# Patient Record
Sex: Male | Born: 1990 | Race: Black or African American | Hispanic: No | Marital: Single | Smoking: Current some day smoker
Health system: Southern US, Community
[De-identification: ages and names within clinical notes are randomized; demographics above are authoritative.]

## PROBLEM LIST (undated history)

## (undated) DIAGNOSIS — A549 Gonococcal infection, unspecified: Secondary | ICD-10-CM

## (undated) DIAGNOSIS — S6010XA Contusion of unspecified finger with damage to nail, initial encounter: Secondary | ICD-10-CM

## (undated) DIAGNOSIS — B86 Scabies: Secondary | ICD-10-CM

## (undated) DIAGNOSIS — M419 Scoliosis, unspecified: Secondary | ICD-10-CM

## (undated) DIAGNOSIS — A539 Syphilis, unspecified: Secondary | ICD-10-CM

## (undated) DIAGNOSIS — B360 Pityriasis versicolor: Secondary | ICD-10-CM

## (undated) DIAGNOSIS — L732 Hidradenitis suppurativa: Secondary | ICD-10-CM

## (undated) DIAGNOSIS — L0292 Furuncle, unspecified: Secondary | ICD-10-CM

## (undated) DIAGNOSIS — Z21 Asymptomatic human immunodeficiency virus [HIV] infection status: Secondary | ICD-10-CM

## (undated) DIAGNOSIS — B20 Human immunodeficiency virus [HIV] disease: Secondary | ICD-10-CM

## (undated) HISTORY — DX: Contusion of unspecified finger with damage to nail, initial encounter: S60.10XA

## (undated) HISTORY — DX: Pityriasis versicolor: B36.0

## (undated) HISTORY — DX: Hidradenitis suppurativa: L73.2

## (undated) HISTORY — PX: BACK SURGERY: SHX140

## (undated) HISTORY — DX: Scabies: B86

## (undated) HISTORY — DX: Furuncle, unspecified: L02.92

## (undated) HISTORY — PX: SPINE SURGERY: SHX786

---
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
## 2011-02-13 ENCOUNTER — Emergency Department (HOSPITAL_COMMUNITY)
Admission: EM | Admit: 2011-02-13 | Discharge: 2011-02-14 | Discharge status: Against Medical Advice | Payer: Medicaid Other

## 2011-02-13 NOTE — ED Notes (Signed)
Pt called for triage x3 and does not present.  Pt placed in OTF status.

## 2011-02-26 ENCOUNTER — Emergency Department (HOSPITAL_COMMUNITY)
Admission: EM | Admit: 2011-02-26 | Discharge: 2011-02-27 | Disposition: A | Discharge status: Home / Self Care | Payer: Medicaid Other | Attending: Emergency Medicine | Admitting: Emergency Medicine

## 2011-02-26 ENCOUNTER — Encounter (HOSPITAL_COMMUNITY): Payer: Self-pay | Admitting: *Deleted

## 2011-02-26 DIAGNOSIS — S0180XA Unspecified open wound of other part of head, initial encounter: Secondary | ICD-10-CM | POA: Insufficient documentation

## 2011-02-26 DIAGNOSIS — R51 Headache: Secondary | ICD-10-CM | POA: Insufficient documentation

## 2011-02-26 DIAGNOSIS — IMO0002 Reserved for concepts with insufficient information to code with codable children: Secondary | ICD-10-CM | POA: Insufficient documentation

## 2011-02-26 DIAGNOSIS — S01112A Laceration without foreign body of left eyelid and periocular area, initial encounter: Secondary | ICD-10-CM

## 2011-02-26 NOTE — ED Notes (Signed)
Pt in c/o laceration above left eyebrow, denies LOC, states he hit his head on a car door

## 2011-02-27 MED ORDER — TETANUS-DIPHTH-ACELL PERTUSSIS 5-2.5-18.5 LF-MCG/0.5 IM SUSP
0.5000 mL | Freq: Once | INTRAMUSCULAR | Status: AC
  Administered 2011-02-27: 0.5 mL via INTRAMUSCULAR
  Filled 2011-02-27: qty 0.5

## 2011-02-27 MED ORDER — LIDOCAINE HCL 1 % IJ SOLN
INTRAMUSCULAR | Status: AC
  Administered 2011-02-27: 05:00:00
  Filled 2011-02-27: qty 20

## 2011-02-27 MED ORDER — IBUPROFEN 600 MG PO TABS: 600.0000 mg | ORAL_TABLET | Freq: Four times a day (QID) | ORAL | Status: AC | PRN

## 2011-02-27 NOTE — ED Notes (Signed)
Pt alert, nad, c/o laceration above left eye, 1 cm laceration noted, bleeding stopped, tet unknown

## 2011-02-27 NOTE — ED Provider Notes (Signed)
History     CSN: [id number] Arrival date & time: 02/26/2011  9:57 PM   First MD Initiated Contact with Patient 02/27/11 0355      Chief Complaint  Patient presents with  . Facial Laceration    (Consider location/radiation/quality/duration/timing/severity/associated sxs/prior treatment) HPI Comments: Patient here after hitting his head on the pointy part of the car door - 1cm laceration to left eyebrow - no LOC  Patient is a 20 y.o. male presenting with scalp laceration. The history is provided by the patient. No language interpreter was used.  Head Laceration This is a new problem. The current episode started today. The problem occurs rarely. The problem has been unchanged. Associated symptoms include headaches. Pertinent negatives include no anorexia, chest pain, chills, coughing, diaphoresis, fatigue, fever, myalgias, nausea, neck pain, rash, urinary symptoms, vertigo, visual change or vomiting. The symptoms are aggravated by nothing. He has tried nothing for the symptoms. The treatment provided no relief.    History reviewed. No pertinent past medical history.  Past Surgical History  Procedure Date  . Spine surgery     History reviewed. No pertinent family history.  History  Substance Use Topics  . Smoking status: Current Some Day Smoker    Types: Cigarettes  . Smokeless tobacco: Not on file  . Alcohol Use: No      Review of Systems  Constitutional: Negative for fever, chills, diaphoresis and fatigue.  HENT: Negative for neck pain.   Respiratory: Negative for cough.   Cardiovascular: Negative for chest pain.  Gastrointestinal: Negative for nausea, vomiting and anorexia.  Musculoskeletal: Negative for myalgias.  Skin: Negative for rash.  Neurological: Positive for headaches. Negative for vertigo.  All other systems reviewed and are negative.    Allergies  Review of patient's allergies indicates no known allergies.  Home Medications   Current Outpatient Rx    Name Route Sig Dispense Refill  . ESOMEPRAZOLE MAGNESIUM 20 MG PO CPDR Oral Take 1 capsule (20 mg total) by mouth daily. 90 capsule 3    BP 130/69  Pulse 96  Temp(Src) 98.6 F (37 C) (Oral)  Resp 20  SpO2 100%  Physical Exam  Nursing note and vitals reviewed. Constitutional: He is oriented to person, place, and time. He appears well-developed and well-nourished. No distress.  HENT:  Head: Normocephalic.    Right Ear: External ear normal.  Left Ear: External ear normal.  Nose: Nose normal.  Mouth/Throat: Oropharynx is clear and moist.  Eyes: Conjunctivae and EOM are normal. Pupils are equal, round, and reactive to light.  Neck: Normal range of motion. Neck supple.  Cardiovascular: Normal rate, regular rhythm and normal heart sounds.   Pulmonary/Chest: Effort normal and breath sounds normal.  Abdominal: Soft. Bowel sounds are normal. There is no tenderness.  Musculoskeletal: Normal range of motion.  Neurological: He is alert and oriented to person, place, and time. He has normal reflexes.  Skin: Skin is warm and dry.  Psychiatric: He has a normal mood and affect. His behavior is normal. Judgment and thought content normal.    ED Course  LACERATION REPAIR Date/Time: 02/27/2011 4:53 AM Performed by: Marisue Humble, Jonet Mathies C. Authorized by: Patrecia Pour Consent: Verbal consent obtained. Written consent not obtained. Risks and benefits: risks, benefits and alternatives were discussed Consent given by: patient Patient understanding: patient states understanding of the procedure being performed Patient consent: the patient's understanding of the procedure does not match consent given Procedure consent: procedure consent does not match procedure scheduled Relevant documents: relevant documents  not present or verified Test results: test results not available Site marked: the operative site was not marked Imaging studies: imaging studies not available Patient identity  confirmed: verbally with patient and arm band Time out: Immediately prior to procedure a "time out" was called to verify the correct patient, procedure, equipment, support staff and site/side marked as required. Body area: head/neck Location details: left eyebrow Laceration length: 1 cm Foreign bodies: no foreign bodies Tendon involvement: none Nerve involvement: none Vascular damage: no Anesthesia: local infiltration Local anesthetic: lidocaine 1% without epinephrine Anesthetic total: 2 ml Patient sedated: no Preparation: Patient was prepped and draped in the usual sterile fashion. Irrigation solution: saline Irrigation method: syringe Amount of cleaning: standard Debridement: none Degree of undermining: none Skin closure: 6-0 nylon Number of sutures: 3 Technique: simple Approximation: loose Approximation difficulty: simple Dressing: antibiotic ointment and 4x4 sterile gauze Patient tolerance: Patient tolerated the procedure well with no immediate complications.   (including critical care time)  Labs Reviewed - No data to display No results found.   1cm laceration to left eyebrow   MDM  Simple laceration to left eyebrow - no LOC         Scarlette Calico C. [place], [place] 02/27/11 [phone]

## 2011-02-27 NOTE — ED Provider Notes (Signed)
Medical screening examination/treatment/procedure(s) were performed by non-physician practitioner and as supervising physician I was immediately available for consultation/collaboration.   Randal Yepiz L Feleshia Zundel, MD 02/27/11 0713 

## 2011-02-27 NOTE — ED Notes (Signed)
Bed:WA01<BR> Expected date:<BR> Expected time:<BR> Means of arrival:<BR> Comments:<BR> closed

## 2011-04-01 ENCOUNTER — Encounter (HOSPITAL_COMMUNITY): Payer: Self-pay | Admitting: *Deleted

## 2011-04-01 ENCOUNTER — Emergency Department (HOSPITAL_COMMUNITY)
Admission: EM | Admit: 2011-04-01 | Discharge: 2011-04-01 | Disposition: A | Discharge status: Home / Self Care | Payer: Medicaid Other | Attending: Emergency Medicine | Admitting: Emergency Medicine

## 2011-04-01 DIAGNOSIS — R51 Headache: Secondary | ICD-10-CM | POA: Insufficient documentation

## 2011-04-01 DIAGNOSIS — R070 Pain in throat: Secondary | ICD-10-CM | POA: Insufficient documentation

## 2011-04-01 DIAGNOSIS — R509 Fever, unspecified: Secondary | ICD-10-CM | POA: Insufficient documentation

## 2011-04-01 DIAGNOSIS — R112 Nausea with vomiting, unspecified: Secondary | ICD-10-CM | POA: Insufficient documentation

## 2011-04-01 DIAGNOSIS — R0602 Shortness of breath: Secondary | ICD-10-CM | POA: Insufficient documentation

## 2011-04-01 DIAGNOSIS — J3489 Other specified disorders of nose and nasal sinuses: Secondary | ICD-10-CM | POA: Insufficient documentation

## 2011-04-01 DIAGNOSIS — R109 Unspecified abdominal pain: Secondary | ICD-10-CM | POA: Insufficient documentation

## 2011-04-01 DIAGNOSIS — J069 Acute upper respiratory infection, unspecified: Secondary | ICD-10-CM | POA: Insufficient documentation

## 2011-04-01 DIAGNOSIS — R209 Unspecified disturbances of skin sensation: Secondary | ICD-10-CM | POA: Insufficient documentation

## 2011-04-01 HISTORY — DX: Scoliosis, unspecified: M41.9

## 2011-04-01 MED ORDER — SODIUM CHLORIDE 0.9 % IV BOLUS (SEPSIS)
1000.0000 mL | Freq: Once | INTRAVENOUS | Status: AC
  Administered 2011-04-01: 1000 mL via INTRAVENOUS

## 2011-04-01 MED ORDER — MORPHINE SULFATE 2 MG/ML IJ SOLN
2.0000 mg | Freq: Once | INTRAMUSCULAR | Status: AC
  Administered 2011-04-01: 2 mg via INTRAVENOUS
  Filled 2011-04-01: qty 1

## 2011-04-01 MED ORDER — IBUPROFEN 800 MG PO TABS: 800.0000 mg | ORAL_TABLET | Freq: Three times a day (TID) | ORAL | Status: DC

## 2011-04-01 MED ORDER — IBUPROFEN 800 MG PO TABS: 800.0000 mg | ORAL_TABLET | Freq: Three times a day (TID) | ORAL | Status: AC

## 2011-04-01 MED ORDER — HYDROCODONE-ACETAMINOPHEN 5-325 MG PO TABS: 1.0000 | ORAL_TABLET | ORAL | Status: AC | PRN

## 2011-04-01 MED ORDER — MORPHINE SULFATE 4 MG/ML IJ SOLN
4.0000 mg | Freq: Once | INTRAMUSCULAR | Status: AC
  Administered 2011-04-01: 4 mg via INTRAVENOUS
  Filled 2011-04-01: qty 1

## 2011-04-01 MED ORDER — HYDROCODONE-ACETAMINOPHEN 5-325 MG PO TABS: 1.0000 | ORAL_TABLET | ORAL | Status: DC | PRN

## 2011-04-01 MED ORDER — ONDANSETRON HCL 4 MG/2ML IJ SOLN
4.0000 mg | Freq: Once | INTRAMUSCULAR | Status: AC
  Administered 2011-04-01: 4 mg via INTRAVENOUS
  Filled 2011-04-01: qty 2

## 2011-04-01 MED ORDER — ACETAMINOPHEN 325 MG PO TABS
650.0000 mg | ORAL_TABLET | Freq: Once | ORAL | Status: AC
  Administered 2011-04-01: 650 mg via ORAL
  Filled 2011-04-01: qty 2

## 2011-04-01 MED ORDER — KETOROLAC TROMETHAMINE 30 MG/ML IJ SOLN: 30.0000 mg | Freq: Once | INTRAMUSCULAR | Status: DC

## 2011-04-01 MED ORDER — ONDANSETRON 8 MG PO TBDP: 8.0000 mg | ORAL_TABLET | Freq: Three times a day (TID) | ORAL | Status: AC | PRN

## 2011-04-01 MED ORDER — KETOROLAC TROMETHAMINE 30 MG/ML IJ SOLN
30.0000 mg | Freq: Once | INTRAMUSCULAR | Status: AC
  Administered 2011-04-01: 30 mg via INTRAVENOUS
  Filled 2011-04-01: qty 1

## 2011-04-01 NOTE — ED Notes (Signed)
Patient complains of abd pain, nausea, hot and cold spells. Pt jerking on stretcher, sts painful because he is dry heaving and would rather vomit something up but cant get anything up due to lack of appetite. Started yesterday.

## 2011-04-01 NOTE — ED Notes (Signed)
No needs at this time, abc intact, will monitor pt.

## 2011-04-01 NOTE — ED Provider Notes (Signed)
Medical screening examination/treatment/procedure(s) were performed by non-physician practitioner and as supervising physician I was immediately available for consultation/collaboration.  Marc Sivertsen P Tallie Dodds, MD 04/01/11 1630 

## 2011-04-01 NOTE — ED Notes (Signed)
Pt is here with fever, sorethroat, headache, vomiting, and nausea.  Body aches.

## 2011-04-01 NOTE — ED Provider Notes (Signed)
History     CSN: [id number]  Arrival date & time 04/01/11  4098   First MD Initiated Contact with Patient 04/01/11 [phone]      Chief Complaint  Patient presents with  . Fever  . Sore Throat  . Headache    (Consider location/radiation/quality/duration/timing/severity/associated sxs/prior treatment) HPI History provided by pt.   Pt developed f/c, diffuse headache, nasal congestion, sore throat, cough, SOB, diffuse lower abd pain, nausea and 1 episode of vomiting yesterday morning.  Denies diarrhea and urinary sx.  Known sick contacts.    Past Medical History  Diagnosis Date  . Scoliosis     Past Surgical History  Procedure Date  . Spine surgery   . Back surgery     No family history on file.  History  Substance Use Topics  . Smoking status: Current Some Day Smoker    Types: Cigarettes  . Smokeless tobacco: Not on file  . Alcohol Use: No      Review of Systems  All other systems reviewed and are negative.    Allergies  Review of patient's allergies indicates no known allergies.  Home Medications  No current outpatient prescriptions on file.  BP 118/67  Pulse 110  Temp(Src) 100 F (37.8 C) (Oral)  Resp 18  SpO2 98%  Physical Exam  Nursing note and vitals reviewed. Constitutional: He is oriented to person, place, and time. He appears well-developed and well-nourished. No distress.  HENT:  Head: Normocephalic and atraumatic.  Mouth/Throat: Oropharynx is clear and moist.  Eyes:       Normal appearance  Neck: Normal range of motion.  Cardiovascular: Normal rate and regular rhythm.   Pulmonary/Chest: Effort normal and breath sounds normal.  Abdominal: Soft. Bowel sounds are normal. He exhibits no distension.       Diffuse, mild lower abd tenderness. Pt does not appear to be uncomfortable w/ palpation.  Musculoskeletal: Normal range of motion.  Neurological: He is alert and oriented to person, place, and time. He has normal reflexes. No cranial nerve  deficit or sensory deficit. He displays a negative Romberg sign. Coordination and gait normal.       Pt has decreased sensation in every nerve distribution bilateral LE.  Tested both light and sharp touch and can feel needle better.  5/5 and equal upper and lower extremity strength.  No past pointing.  No pronator drift.    Skin: Skin is warm. No rash noted.  Psychiatric: He has a normal mood and affect. His behavior is normal.    ED Course  Procedures (including critical care time)  Labs Reviewed - No data to display No results found.   1. Viral URI   2. Nausea and vomiting       MDM  Healthy 21yo M presents w/ c/o headache, sore throat, cough, abd pain, N/V since yesterday.  On exam, elevated temp, mild tachycardia, no respiratory distress, no coughing, no vomiting, abd benign but diffuse, mild lower abd ttp.  Pt received IV fluids, pain and nausea medication and tylenol.  Temp improved from 100.0 to 99.2 and HR 110 to 95.  Pt reports feeling better.  Continues to have a sore throat.  Based on centor criteria, strep very unlikely.  Based on duration of cough and associated upper resp tract sx, pneumonia unlikely.  Pt receiving more fluids and another 2mg  morphine and I have given him a po challenge.  Will reassess shortly. 9:16 AM   Pt reports that he is feeling better and  would like to go home.  HR 82.  No vomiting in ED and he is tolerating pos.  D/c'd home w/ zofran, ibuprofen and vicodin and recommendation to rest and drink plenty of fluids.  He has a PCP to f/u with.  Return precautions discussed.         Richard Rojas, [place] 04/01/11 1549

## 2011-05-18 ENCOUNTER — Inpatient Hospital Stay (HOSPITAL_COMMUNITY)
Admission: EM | Admit: 2011-05-18 | Discharge: 2011-05-23 | DRG: 977 | Disposition: A | Discharge status: Home / Self Care | Payer: Medicaid Other | Source: Ambulatory Visit | Attending: Family Medicine | Admitting: Family Medicine

## 2011-05-18 ENCOUNTER — Encounter (HOSPITAL_COMMUNITY): Payer: Self-pay | Admitting: *Deleted

## 2011-05-18 DIAGNOSIS — K209 Esophagitis, unspecified without bleeding: Secondary | ICD-10-CM | POA: Diagnosis present

## 2011-05-18 DIAGNOSIS — N179 Acute kidney failure, unspecified: Secondary | ICD-10-CM | POA: Diagnosis present

## 2011-05-18 DIAGNOSIS — R1032 Left lower quadrant pain: Secondary | ICD-10-CM

## 2011-05-18 DIAGNOSIS — R197 Diarrhea, unspecified: Secondary | ICD-10-CM | POA: Diagnosis present

## 2011-05-18 DIAGNOSIS — E871 Hypo-osmolality and hyponatremia: Secondary | ICD-10-CM | POA: Diagnosis present

## 2011-05-18 DIAGNOSIS — K219 Gastro-esophageal reflux disease without esophagitis: Secondary | ICD-10-CM

## 2011-05-18 DIAGNOSIS — E860 Dehydration: Secondary | ICD-10-CM

## 2011-05-18 DIAGNOSIS — K5289 Other specified noninfective gastroenteritis and colitis: Secondary | ICD-10-CM | POA: Diagnosis present

## 2011-05-18 DIAGNOSIS — M4120 Other idiopathic scoliosis, site unspecified: Secondary | ICD-10-CM | POA: Diagnosis present

## 2011-05-18 DIAGNOSIS — R112 Nausea with vomiting, unspecified: Secondary | ICD-10-CM

## 2011-05-18 DIAGNOSIS — D696 Thrombocytopenia, unspecified: Secondary | ICD-10-CM | POA: Diagnosis present

## 2011-05-18 DIAGNOSIS — R195 Other fecal abnormalities: Secondary | ICD-10-CM | POA: Diagnosis not present

## 2011-05-18 DIAGNOSIS — R933 Abnormal findings on diagnostic imaging of other parts of digestive tract: Secondary | ICD-10-CM

## 2011-05-18 DIAGNOSIS — E46 Unspecified protein-calorie malnutrition: Secondary | ICD-10-CM | POA: Diagnosis present

## 2011-05-18 DIAGNOSIS — E876 Hypokalemia: Secondary | ICD-10-CM | POA: Diagnosis present

## 2011-05-18 DIAGNOSIS — B20 Human immunodeficiency virus [HIV] disease: Principal | ICD-10-CM | POA: Diagnosis present

## 2011-05-18 DIAGNOSIS — R4182 Altered mental status, unspecified: Secondary | ICD-10-CM

## 2011-05-18 HISTORY — DX: Syphilis, unspecified: A53.9

## 2011-05-18 MED ORDER — ONDANSETRON 8 MG PO TBDP
8.0000 mg | ORAL_TABLET | Freq: Once | ORAL | Status: AC
  Administered 2011-05-18: 8 mg via ORAL
  Filled 2011-05-18: qty 1

## 2011-05-18 MED ORDER — ACETAMINOPHEN 500 MG PO TABS
1000.0000 mg | ORAL_TABLET | Freq: Once | ORAL | Status: AC
  Administered 2011-05-18: 1000 mg via ORAL
  Filled 2011-05-18: qty 2

## 2011-05-18 NOTE — ED Notes (Signed)
Pt c/o n/v/d and fever since past Friday. Pt states he has been treating self w/ imodium and tylenol at home w/o relief.

## 2011-05-18 NOTE — ED Notes (Signed)
Pt vomiting in triage 

## 2011-05-19 ENCOUNTER — Inpatient Hospital Stay (HOSPITAL_COMMUNITY): Payer: Medicaid Other

## 2011-05-19 LAB — URINALYSIS, ROUTINE W REFLEX MICROSCOPIC
Glucose, UA: NEGATIVE mg/dL
Ketones, ur: 15 mg/dL — AB
Leukocytes, UA: NEGATIVE
Nitrite: NEGATIVE
Protein, ur: 30 mg/dL — AB
Urobilinogen, UA: 0.2 mg/dL (ref 0.0–1.0)

## 2011-05-19 LAB — DIFFERENTIAL
Basophils Absolute: 0 10*3/uL (ref 0.0–0.1)
Eosinophils Absolute: 0.1 10*3/uL (ref 0.0–0.7)
Eosinophils Relative: 1 % (ref 0–5)
Lymphocytes Relative: 13 % (ref 12–46)
Lymphs Abs: 0.5 10*3/uL — ABNORMAL LOW (ref 0.7–4.0)
Monocytes Absolute: 0.4 10*3/uL (ref 0.1–1.0)
Monocytes Relative: 14 % — ABNORMAL HIGH (ref 3–12)
Monocytes Relative: 10 % (ref 3–12)
Neutro Abs: 3.9 10*3/uL (ref 1.7–7.7)
Neutro Abs: 3.5 10*3/uL (ref 1.7–7.7)
Neutrophils Relative %: 72 % (ref 43–77)
WBC Morphology: INCREASED

## 2011-05-19 LAB — RAPID URINE DRUG SCREEN, HOSP PERFORMED
Barbiturates: NOT DETECTED
Benzodiazepines: NOT DETECTED
Cocaine: NOT DETECTED
Tetrahydrocannabinol: POSITIVE — AB

## 2011-05-19 LAB — COMPREHENSIVE METABOLIC PANEL
ALT: 13 U/L (ref 0–53)
AST: 31 U/L (ref 0–37)
Alkaline Phosphatase: 55 U/L (ref 39–117)
CO2: 25 mEq/L (ref 19–32)
Calcium: 8.1 mg/dL — ABNORMAL LOW (ref 8.4–10.5)
GFR calc non Af Amer: >90 mL/min (ref >90)
Potassium: 3 mEq/L — ABNORMAL LOW (ref 3.5–5.1)
Sodium: 126 mEq/L — ABNORMAL LOW (ref 135–145)
Total Protein: 5.9 g/dL — ABNORMAL LOW (ref 6.0–8.3)

## 2011-05-19 LAB — AMMONIA: Ammonia: 48 umol/L (ref 11–60)

## 2011-05-19 LAB — BASIC METABOLIC PANEL
BUN: 16 mg/dL (ref 6–23)
BUN: 9 mg/dL (ref 6–23)
CO2: 25 mEq/L (ref 19–32)
CO2: 22 mEq/L (ref 19–32)
Calcium: 8 mg/dL — ABNORMAL LOW (ref 8.4–10.5)
Chloride: 77 mEq/L — ABNORMAL LOW (ref 96–112)
Chloride: 88 mEq/L — ABNORMAL LOW (ref 96–112)
Chloride: 90 mEq/L — ABNORMAL LOW (ref 96–112)
Creatinine, Ser: 1.06 mg/dL (ref 0.50–1.35)
GFR calc Af Amer: >90 mL/min (ref >90)
GFR calc Af Amer: >90 mL/min (ref >90)
GFR calc Af Amer: >90 mL/min (ref >90)
GFR calc non Af Amer: >90 mL/min (ref >90)
GFR calc non Af Amer: >90 mL/min (ref >90)
Glucose, Bld: 113 mg/dL — ABNORMAL HIGH (ref 70–99)
Glucose, Bld: 117 mg/dL — ABNORMAL HIGH (ref 70–99)
Potassium: 3 mEq/L — ABNORMAL LOW (ref 3.5–5.1)
Potassium: 3.4 mEq/L — ABNORMAL LOW (ref 3.5–5.1)
Potassium: 3.5 mEq/L (ref 3.5–5.1)
Sodium: 123 mEq/L — ABNORMAL LOW (ref 135–145)
Sodium: 122 mEq/L — ABNORMAL LOW (ref 135–145)
Sodium: 123 mEq/L — ABNORMAL LOW (ref 135–145)

## 2011-05-19 LAB — CSF CELL COUNT WITH DIFFERENTIAL: WBC, CSF: 2 /mm3 (ref 0–5)

## 2011-05-19 LAB — CBC
HCT: 45.7 % (ref 39.0–52.0)
HCT: 40.5 % (ref 39.0–52.0)
Hemoglobin: 14.5 g/dL (ref 13.0–17.0)
MCH: 30 pg (ref 26.0–34.0)
MCHC: 36.8 g/dL — ABNORMAL HIGH (ref 30.0–36.0)
MCV: 83.9 fL (ref 78.0–100.0)
Platelets: 120 10*3/uL — ABNORMAL LOW (ref 150–400)
RBC: 4.83 MIL/uL (ref 4.22–5.81)
RDW: 12.6 % (ref 11.5–15.5)
WBC: 5.5 10*3/uL (ref 4.0–10.5)
WBC: 4.4 10*3/uL (ref 4.0–10.5)

## 2011-05-19 LAB — PATHOLOGIST SMEAR REVIEW

## 2011-05-19 LAB — PROTEIN AND GLUCOSE, CSF: Total  Protein, CSF: 56 mg/dL — ABNORMAL HIGH (ref 15–45)

## 2011-05-19 LAB — DIC (DISSEMINATED INTRAVASCULAR COAGULATION)PANEL
D-Dimer, Quant: 6.75 ug/mL-FEU — ABNORMAL HIGH (ref 0.00–0.48)
Fibrinogen: 541 mg/dL — ABNORMAL HIGH (ref 204–475)
INR: 1.38 (ref 0.00–1.49)
Smear Review: NONE SEEN
aPTT: 33 seconds (ref 24–37)

## 2011-05-19 LAB — GRAM STAIN

## 2011-05-19 LAB — HIV ANTIBODY (ROUTINE TESTING W REFLEX): HIV: REACTIVE — AB

## 2011-05-19 LAB — C-REACTIVE PROTEIN: CRP: 36.21 mg/dL — ABNORMAL HIGH (ref <0.60)

## 2011-05-19 LAB — URINE MICROSCOPIC-ADD ON

## 2011-05-19 LAB — CRYPTOCOCCAL ANTIGEN, CSF: Crypto Ag: NEGATIVE

## 2011-05-19 LAB — OCCULT BLOOD X 1 CARD TO LAB, STOOL: Fecal Occult Bld: POSITIVE

## 2011-05-19 LAB — LACTATE DEHYDROGENASE: LDH: 220 U/L (ref 94–250)

## 2011-05-19 LAB — RPR TITER: RPR Titer: 1:4 {titer} — AB

## 2011-05-19 LAB — MRSA PCR SCREENING: MRSA by PCR: NEGATIVE

## 2011-05-19 MED ORDER — MORPHINE SULFATE 2 MG/ML IJ SOLN
1.0000 mg | INTRAMUSCULAR | Status: DC | PRN
  Administered 2011-05-19 – 2011-05-21 (×4): 1 mg via INTRAVENOUS
  Filled 2011-05-19 (×4): qty 1

## 2011-05-19 MED ORDER — ONDANSETRON 8 MG/NS 50 ML IVPB
8.0000 mg | Freq: Once | INTRAVENOUS | Status: AC
  Administered 2011-05-19: 8 mg via INTRAVENOUS
  Filled 2011-05-19: qty 8

## 2011-05-19 MED ORDER — PROMETHAZINE HCL 25 MG/ML IJ SOLN
25.0000 mg | Freq: Four times a day (QID) | INTRAMUSCULAR | Status: DC | PRN
  Administered 2011-05-19 (×2): 25 mg via INTRAVENOUS
  Filled 2011-05-19 (×3): qty 1

## 2011-05-19 MED ORDER — DEXTROSE 5 % IV SOLN
2.0000 g | Freq: Two times a day (BID) | INTRAVENOUS | Status: DC
  Administered 2011-05-19 – 2011-05-20 (×2): 2 g via INTRAVENOUS
  Filled 2011-05-19 (×3): qty 2

## 2011-05-19 MED ORDER — IOHEXOL 300 MG/ML  SOLN
80.0000 mL | Freq: Once | INTRAMUSCULAR | Status: AC | PRN
  Administered 2011-05-19: 80 mL via INTRAVENOUS

## 2011-05-19 MED ORDER — FLUCONAZOLE IN SODIUM CHLORIDE 400-0.9 MG/200ML-% IV SOLN
400.0000 mg | INTRAVENOUS | Status: DC
  Administered 2011-05-19: 400 mg via INTRAVENOUS
  Filled 2011-05-19 (×2): qty 200

## 2011-05-19 MED ORDER — POTASSIUM CHLORIDE 10 MEQ/100ML IV SOLN
10.0000 meq | INTRAVENOUS | Status: AC
  Administered 2011-05-19 (×4): 10 meq via INTRAVENOUS
  Filled 2011-05-19 (×4): qty 100

## 2011-05-19 MED ORDER — SODIUM CHLORIDE 0.9 % IV SOLN: 1000.0000 mL | INTRAVENOUS | Status: DC

## 2011-05-19 MED ORDER — PANTOPRAZOLE SODIUM 40 MG IV SOLR
40.0000 mg | INTRAVENOUS | Status: DC
  Administered 2011-05-19 – 2011-05-22 (×4): 40 mg via INTRAVENOUS
  Filled 2011-05-19 (×5): qty 40

## 2011-05-19 MED ORDER — SODIUM CHLORIDE 0.9 % IV BOLUS (SEPSIS)
1000.0000 mL | Freq: Once | INTRAVENOUS | Status: AC
  Administered 2011-05-19: 1000 mL via INTRAVENOUS

## 2011-05-19 MED ORDER — CIPROFLOXACIN IN D5W 400 MG/200ML IV SOLN
400.0000 mg | Freq: Two times a day (BID) | INTRAVENOUS | Status: DC
  Administered 2011-05-19: 400 mg via INTRAVENOUS
  Filled 2011-05-19 (×2): qty 200

## 2011-05-19 MED ORDER — VANCOMYCIN HCL IN DEXTROSE 1-5 GM/200ML-% IV SOLN
1000.0000 mg | Freq: Three times a day (TID) | INTRAVENOUS | Status: DC
  Administered 2011-05-19 – 2011-05-20 (×2): 1000 mg via INTRAVENOUS
  Filled 2011-05-19 (×4): qty 200

## 2011-05-19 MED ORDER — SODIUM CHLORIDE 0.45 % IV SOLN
INTRAVENOUS | Status: DC
  Administered 2011-05-19: 10:00:00 via INTRAVENOUS

## 2011-05-19 MED ORDER — SODIUM CHLORIDE 0.9 % IV SOLN
INTRAVENOUS | Status: DC
  Administered 2011-05-19: 11:00:00 via INTRAVENOUS
  Administered 2011-05-20 – 2011-05-21 (×3): 1000 mL via INTRAVENOUS
  Administered 2011-05-21: 01:00:00 via INTRAVENOUS

## 2011-05-19 MED ORDER — VANCOMYCIN HCL 1000 MG IV SOLR
750.0000 mg | Freq: Three times a day (TID) | INTRAVENOUS | Status: DC
  Filled 2011-05-19 (×2): qty 750

## 2011-05-19 MED ORDER — ONDANSETRON HCL 4 MG/2ML IJ SOLN
INTRAMUSCULAR | Status: AC
  Filled 2011-05-19: qty 2

## 2011-05-19 MED ORDER — PNEUMOCOCCAL VAC POLYVALENT 25 MCG/0.5ML IJ INJ
0.5000 mL | INJECTION | INTRAMUSCULAR | Status: AC
  Administered 2011-05-20: 0.5 mL via INTRAMUSCULAR
  Filled 2011-05-19: qty 0.5

## 2011-05-19 MED ORDER — PIPERACILLIN-TAZOBACTAM 3.375 G IVPB
3.3750 g | Freq: Three times a day (TID) | INTRAVENOUS | Status: DC
  Filled 2011-05-19 (×2): qty 50

## 2011-05-19 MED ORDER — SODIUM CHLORIDE 0.9 % IV SOLN
5.0000 mg/kg | Freq: Two times a day (BID) | INTRAVENOUS | Status: DC
  Administered 2011-05-19 – 2011-05-20 (×2): 310 mg via INTRAVENOUS
  Filled 2011-05-19 (×3): qty 310

## 2011-05-19 MED ORDER — HEPARIN SODIUM (PORCINE) 5000 UNIT/ML IJ SOLN
5000.0000 [IU] | Freq: Three times a day (TID) | INTRAMUSCULAR | Status: DC
  Filled 2011-05-19 (×4): qty 1

## 2011-05-19 MED ORDER — ONDANSETRON HCL 4 MG/2ML IJ SOLN
4.0000 mg | Freq: Four times a day (QID) | INTRAMUSCULAR | Status: DC | PRN
  Administered 2011-05-19: 4 mg via INTRAVENOUS
  Filled 2011-05-19: qty 2

## 2011-05-19 MED ORDER — ACETAMINOPHEN 500 MG PO TABS
1000.0000 mg | ORAL_TABLET | Freq: Once | ORAL | Status: AC
  Administered 2011-05-19: 1000 mg via ORAL
  Filled 2011-05-19: qty 2

## 2011-05-19 MED ORDER — SODIUM CHLORIDE 0.9 % IV BOLUS (SEPSIS): 1000.0000 mL | Freq: Once | INTRAVENOUS | Status: DC

## 2011-05-19 MED ORDER — ONDANSETRON HCL 4 MG/2ML IJ SOLN
INTRAMUSCULAR | Status: AC
  Filled 2011-05-19: qty 4

## 2011-05-19 MED ORDER — ONDANSETRON 8 MG/NS 50 ML IVPB
8.0000 mg | Freq: Four times a day (QID) | INTRAVENOUS | Status: DC | PRN
  Administered 2011-05-20 – 2011-05-22 (×3): 8 mg via INTRAVENOUS
  Filled 2011-05-19 (×5): qty 8

## 2011-05-19 MED ORDER — METRONIDAZOLE IN NACL 5-0.79 MG/ML-% IV SOLN
500.0000 mg | Freq: Three times a day (TID) | INTRAVENOUS | Status: DC
  Administered 2011-05-19 – 2011-05-23 (×12): 500 mg via INTRAVENOUS
  Filled 2011-05-19 (×17): qty 100

## 2011-05-19 MED ORDER — PROMETHAZINE HCL 25 MG/ML IJ SOLN
25.0000 mg | INTRAMUSCULAR | Status: DC | PRN
  Administered 2011-05-19 – 2011-05-21 (×7): 25 mg via INTRAVENOUS
  Filled 2011-05-19 (×6): qty 1

## 2011-05-19 MED ORDER — PROMETHAZINE HCL 25 MG/ML IJ SOLN
25.0000 mg | INTRAMUSCULAR | Status: AC
  Administered 2011-05-19: 25 mg via INTRAVENOUS
  Filled 2011-05-19: qty 1

## 2011-05-19 MED ORDER — PROMETHAZINE HCL 25 MG PO TABS: 12.5000 mg | ORAL_TABLET | Freq: Four times a day (QID) | ORAL | Status: DC | PRN

## 2011-05-19 NOTE — ED Provider Notes (Signed)
History     CSN: [id number]  Arrival date & time 05/18/11  2219   First MD Initiated Contact with Patient 05/18/11 2329      Chief Complaint  Patient presents with  . Fever  . Nausea  . Emesis    (Consider location/radiation/quality/duration/timing/severity/associated sxs/prior treatment) HPI Comments: 21 year old male with a history of nausea vomiting and diarrhea for 3 days. He states this was acute in onset, persistent, nothing makes this better or worse and it is associated with fever to 102.9 on arrival. He has mild tenderness in the left lower quadrant, no blood in the stools, no dysuria, no rashes, mild headache. He does get mild lightheadedness when he stands. Denies any sick contacts, travel, recent antibiotic use. He denies any other medical problems other than scoliosis status post spine surgery in the past  Patient is a 21 y.o. male presenting with fever and vomiting. The history is provided by the patient, a relative and medical records.  Fever Primary symptoms of the febrile illness include fever and vomiting.  Emesis  Associated symptoms include a fever.    Past Medical History  Diagnosis Date  . Scoliosis     Past Surgical History  Procedure Date  . Spine surgery   . Back surgery     History reviewed. No pertinent family history.  History  Substance Use Topics  . Smoking status: Current Some Day Smoker    Types: Cigarettes  . Smokeless tobacco: Not on file  . Alcohol Use: No      Review of Systems  Constitutional: Positive for fever.  Gastrointestinal: Positive for vomiting.  All other systems reviewed and are negative.    Allergies  Review of patient's allergies indicates no known allergies.  Home Medications  No current outpatient prescriptions on file.  BP 108/82  Pulse 154  Temp(Src) 99.2 F (37.3 C) (Oral)  Resp 20  SpO2 98%  Physical Exam  Nursing note and vitals reviewed. Constitutional: He appears well-developed and  well-nourished.  HENT:  Head: Normocephalic and atraumatic.  Mouth/Throat: No oropharyngeal exudate.       Mucous membranes mildly dehydrated  Eyes: Conjunctivae and EOM are normal. Pupils are equal, round, and reactive to light. Right eye exhibits no discharge. Left eye exhibits no discharge. No scleral icterus.  Neck: Normal range of motion. Neck supple. No JVD present. No thyromegaly present.  Cardiovascular: Regular rhythm, normal heart sounds and intact distal pulses.  Exam reveals no gallop and no friction rub.   No murmur heard.      Tachycardia  Pulmonary/Chest: Effort normal and breath sounds normal. No respiratory distress. He has no wheezes. He has no rales.  Abdominal: Soft. Bowel sounds are normal. He exhibits no distension and no mass. There is tenderness ( Mild left lower quadrant tenderness, no rebound, non-peritoneal, no pain at McBurney's point).  Musculoskeletal: Normal range of motion. He exhibits no edema and no tenderness.  Lymphadenopathy:    He has no cervical adenopathy.  Neurological: He is alert. Coordination normal.  Skin: Skin is warm and dry. No rash noted. No erythema.  Psychiatric: He has a normal mood and affect. His behavior is normal.    ED Course  Procedures (including critical care time)  Labs Reviewed  BASIC METABOLIC PANEL - Abnormal; Notable for the following:    Sodium 116 (*)    Potassium 3.0 (*)    Chloride 77 (*)    Glucose, Bld 131 (*)    All other components within normal  limits  CBC - Abnormal; Notable for the following:    MCHC 36.8 (*) PRE-WARMING TECHNIQUE USED   Platelets 134 (*)    All other components within normal limits  DIFFERENTIAL - Abnormal; Notable for the following:    Monocytes Relative 14 (*)    All other components within normal limits  URINALYSIS, ROUTINE W REFLEX MICROSCOPIC   No results found.   1. Dehydration   2. Hyponatremia       MDM  Fever tachycardia and diarrhea type illness. Will check  potassium, urinalysis to evaluate level of dehydration, IV fluids and antiemetics, antipyretics, reevaluate.  Patient reevaluated and has persistent somnolence though he is easily arousable. His tachycardia has improved significantly to 115 and his fever has defervesced after acetaminophen. Laboratory evaluation reveals that he is significantly hyponatremic with a level of 116, potassium 3.0. Blood counts at this time are normal.  I discussed his care with the admitting family practice resident who has accepted him in transfer to a step down bed to        Vida Roller, MD 05/19/11 [phone]

## 2011-05-19 NOTE — H&P (Signed)
I have seen and examined this patient. I have discussed with Dr (s) Piloto & Alvester Morin.  I agree with their findings and plans as documented in their admission note.  Acute Issues 1. Acute Colitis - Infectious Vs Inflammatory origin - Sending Stool for routine culture, lactorferrin, hemoccult, C.diff PCR, EHEC stool antigen - Starting Cipro/Flagyl IV empirically - If not improved in next 48 hours or if condition worsens then consult GI service for consideration of endoscopic evaluation.   2. Hyponatremia, hypovolemic - Secondary to nausea & vomiting from problem no. 1 - Na 116 -- 6 hours/IV NS --> 126. - Avoid rapid correction of serum sodium concentration. Keep rise in sodium concentration to less than 12 mEq/L in 24 hours - Decrease Normal Saline IVF rate. Recheck serum sodium. If continuing to rise, change to D5 0.45 Normal Saline and place at [facility], then recheck serum sodium in 6 hours to see if able to resume rate of IVF rate at closer to maintenance rate.

## 2011-05-19 NOTE — Progress Notes (Addendum)
ANTIBIOTIC CONSULT NOTE - INITIAL  Pharmacy Consult for Vancomycin and Zosyn Indication: febrile illness  No Known Allergies  Patient Measurements: Height: 6\' 1"  (185.4 cm) IBW/kg (Calculated) : 79.9  Actual wt: 66.9kg per RN  Vital Signs: Temp: 102.9 F (39.4 C) (03/04 1807) Temp src: Oral (03/04 1807) BP: 125/62 mmHg (03/04 1759) Pulse Rate: 129  (03/04 1807) Intake/Output from previous day: 03/03 0701 - 03/04 0700 In: 50 [IV Piggyback:50] Out: 400 [Urine:400] Intake/Output from this shift: Total I/O In: 2548.2 [P.O.:240; I.V.:1696.2; IV Piggyback:612] Out: 150 [Urine:150]  Labs:  Basename 05/19/11 1330 05/19/11 1050 05/19/11 1043 05/19/11 0600 05/19/11 0015  WBC -- -- -- 4.4 5.5  HGB -- -- -- 14.5 16.8  PLT -- -- 125* 120* 134*  LABCREA -- -- -- -- --  CREATININE 0.84 0.84 -- 0.82 --   The CrCl is unknown because both a height and weight (above a minimum accepted value) are required for this calculation. No results found for this basename: VANCOTROUGH:2,VANCOPEAK:2,VANCORANDOM:2,GENTTROUGH:2,GENTPEAK:2,GENTRANDOM:2,TOBRATROUGH:2,TOBRAPEAK:2,TOBRARND:2,AMIKACINPEAK:2,AMIKACINTROU:2,AMIKACIN:2, in the last 72 hours   Microbiology: Recent Results (from the past 720 hour(s))  MRSA PCR SCREENING     Status: Normal   Collection Time   05/19/11  4:49 AM      Component Value Range Status Comment   MRSA by PCR NEGATIVE  NEGATIVE  Final   CLOSTRIDIUM DIFFICILE BY PCR     Status: Normal   Collection Time   05/19/11  5:30 AM      Component Value Range Status Comment   C difficile by pcr NEGATIVE  NEGATIVE  Final     Medical History: Past Medical History  Diagnosis Date  . Scoliosis     Medications:  Scheduled:    . acetaminophen  1,000 mg Oral Once  . acetaminophen  1,000 mg Oral Once  . metronidazole  500 mg Intravenous Q8H  . ondansetron (ZOFRAN) IV  8 mg Intravenous Once  . ondansetron  8 mg Oral Once  . pantoprazole (PROTONIX) IV  40 mg Intravenous Q24H  .  pneumococcal 23 valent vaccine  0.5 mL Intramuscular Tomorrow-1000  . potassium chloride  10 mEq Intravenous Q1 Hr x 4  . promethazine  25 mg Intravenous STAT  . sodium chloride  1,000 mL Intravenous Once  . sodium chloride  1,000 mL Intravenous Once  . sodium chloride  1,000 mL Intravenous Once  . DISCONTD: ciprofloxacin  400 mg Intravenous Q12H  . DISCONTD: heparin  5,000 Units Subcutaneous Q8H  . DISCONTD: sodium chloride  1,000 mL Intravenous Once   Assessment: Pt presents with febrile illness (Tm 103) and worsening GI sx over past week to start on empiric antibiotics while being worked up for source of infection (GI virus vs. STD being most likely causes).    Pt has normal renal function (est CrCl >100).    Goal of Therapy:  Vancomycin trough level 15-20 mcg/ml  Plan:  1) Vancomycin 1000mg  IV Q8h 2) Zosyn 3.375gm IV Q8h 3) F/U vanc trough level at steady state 4) F/U pt progress and narrow tx once source determined.  Elson Clan 05/19/2011,6:47 PM

## 2011-05-19 NOTE — ED Notes (Addendum)
Call placed to pt's grandmother, Rosey Bath #[phone]. Grandmother made aware that pt is being transferred to [facility].

## 2011-05-19 NOTE — ED Notes (Signed)
Monica, RN on 3100 given report, [facility] called.

## 2011-05-19 NOTE — H&P (Signed)
Family Medicine Teaching [facility] Admission History and Physical  Patient name: Richard Rojas Medical record number: [id number] Date of birth: [birth year] Age: 21 y.o. Gender: male  Primary Care Provider: Edd Arbour, MD, MD  Chief Complaint: fever, nause vomit and disrrhea. History of Present Illness: Richard Rojas is a 21 y.o. male presenting at [facility] with fever 103 and history of  2 days of intractable nausea, vomiting and diarrhea.Very difficult interview since pt is restless in bed and answers are reduced to "yes or no" He denies use of illicit drugs and alcohol or hx of STD's . Also denies hx of other family members with recent illness, although per Dr at [facility] pt's mother and sister had diarrhea a week ago but nothing closer to what pt is presenting. Pt has not able to eat or drink for 2 days. . Pt since was transferred had vomited (bilious, non-bloody) twice. His other complaint is left lower quadrant pain and mild headache. No chest pain, no SOB.  Patient Active Problem List  Diagnoses  . SCOLIOSIS  . GERD (gastroesophageal reflux disease)  . Unprotected sexual intercourse  . Syphilis  . Tinea versicolor   Past Medical History: Past Medical History  Diagnosis Date  . Scoliosis    Past Surgical History: Past Surgical History  Procedure Date  . Spine surgery   . Back surgery    Social History: History   Social History  . Marital Status: Single    Spouse Name: N/A    Number of Children: N/A  . Years of Education: N/A   Social History Main Topics  . Smoking status: Current Some Day Smoker    Types: Cigarettes  . Smokeless tobacco: None  . Alcohol Use: No  . Drug Use: No  . Sexually Active: Yes    Birth Control/ Protection: None   Other Topics Concern  . None   Social History Narrative  . None   Family History: History reviewed. No pertinent family history. Allergies: No Known Allergies Current Facility-Administered Medications  Medication  Dose Route Frequency Provider Last Rate Last Dose  . acetaminophen (TYLENOL) tablet 1,000 mg  1,000 mg Oral Once Richard Rojas, [place]   1,000 mg at 05/18/11 2305  . acetaminophen (TYLENOL) tablet 1,000 mg  1,000 mg Oral Once Richard Roller, MD   1,000 mg at 05/19/11 0107  . morphine 2 MG/ML injection 1 mg  1 mg Intravenous Q2H PRN Richard Piloto, MD      . ondansetron (ZOFRAN) 8 mg/NS 50 ml IVPB  8 mg Intravenous Once Richard Piloto, MD      . ondansetron (ZOFRAN-ODT) disintegrating tablet 8 mg  8 mg Oral Once Richard Fontana, PA   8 mg at 05/18/11 2305  . promethazine (PHENERGAN) injection 25 mg  25 mg Intravenous STAT Richard Roller, MD   25 mg at 05/19/11 0107  . sodium chloride 0.9 % bolus 1,000 mL  1,000 mL Intravenous Once Richard Roller, MD   1,000 mL at 05/19/11 0114    Review Of Systems:  Review of Systems  Constitutional: Positive for fever.  HENT: Negative for neck pain.   Eyes: Negative for blurred vision.  Respiratory: Negative for cough.   Cardiovascular: Negative for chest pain and palpitations.  Gastrointestinal: Positive for nausea, vomiting, abdominal pain and diarrhea. Negative for blood in stool.  Genitourinary: Negative for dysuria, frequency and hematuria.  Musculoskeletal: Negative for back pain.  Skin: Negative for rash.  Neurological: Positive for headaches. Negative for  dizziness.  Endo/Heme/Allergies: Does not bruise/bleed easily.  Psychiatric/Behavioral: The patient is not nervous/anxious.      Physical Exam: Filed Vitals:   05/19/11 0330  BP:   Pulse:   Temp:   Resp: 22   Gen:  Pt close to cachectic and restless in bed.  Poorly cooperative. HEENT: mildly dry mucous membranes. Neck supple, no adenopathies. Pupils: miosis with minimal reaction to light. EOMI.  CV: Tachycardic, no murmurs rubs or gallops PULM: Clear to auscultation bilaterally. No wheezes/rales/rhonchi ABD: Soft, excavated, tender to palpation isolated on LLQ no guarding,  to  normal bowel sounds.  EXT: No edema. Anal exam. No lesion on anal mucosa Neuro: Alert, agitated and oriented x3. No focalization. 5/5 strength on 4 extremities. No sensorial deficit. No cooperative to explore coordination. Gait not examined at this time. No meningeal signs present on examination.  Labs and Imaging: Lab Results  Component Value Date/Time   NA 116* 05/19/2011 12:15 AM   K 3.0* 05/19/2011 12:15 AM   CL 77* 05/19/2011 12:15 AM   CO2 24 05/19/2011 12:15 AM   BUN 16 05/19/2011 12:15 AM   CREATININE 1.06 05/19/2011 12:15 AM   GLUCOSE 131* 05/19/2011 12:15 AM   Lab Results  Component Value Date   WBC 5.5 05/19/2011   HGB 16.8 05/19/2011   HCT 45.7 05/19/2011   MCV 82.6 05/19/2011   PLT 134* 05/19/2011    Assessment and Plan: Richard Rojas is a 21 y.o.  male presenting with fever, nausea, vomiting and diarrhea. The simplest explanation could be a viral GI with dehydration but pt has presented with a complex picture that make a broader differential including Drug Abuse, STD, TTP/HUS(?) 1. Fever, nausea, vomiting and diarrhea: Most likely viral etiology. Family had been sick with GI symptoms for a  week. Pt symptoms are more severe. We can't r/u a underlying condition since pt was positive last year for Syphilis and has multiple partners with no protected sexual activity. - Admit to inpatient  - Hydration - NPO - Zofran and Phenergan - Norovirus PCR. - Fecal lactoferrin, stool ova and parasite, hemocult. - HIV, CD4, RPR, GC and chlamydia. 2. Altered mental status, headache, miosis: Agitation. Restlessness. Could be secondary to hyponatremia but miosis is not part of the classic presentation.  - UDS, Etoh and Ammonia level  - Head CT  3. Abdominal pain: Could be explained by pt GI symptoms but due to intensity of pain we decided to r/o acute abdominal pathology. -  Abdominal CT  5. Electrolytes disbalances: hyponatremia and hypokalemia we think could be secondary to intractable vomiting and  diarrhea -IV fluids bolus and then continuous with 173ml/h -K repletion  6. Acute renal failure: Cr elevated 1.06 baseline on 0.5. We think secondary to deshydration but more complex picture with acute thrombocytopenia.  - Hydrate and recheck Cr. FeNa.   7. Thombocytopenia: platelet count of 135. Due to complexity and severity of symptoms presented with renal failure and thrombocytopenia it is important r/o HUS or TTP . - Peripheral smear  - DIC panel   FEN/GI: NPO, IV NS and Kcl 10 meq IV X4 Prophylaxis: SCD's Disposition: Pending improvement and test results.  Richard Rojas Richard Rojas PGY-1 FMTS Pager [phone] --------------------------------------------------------------------------------------------------------------------------------------------------------------------------------------------- R3 Addendum:  Patient seen and examined agree with the above. Please see excellent PGY one note for full details. Briefly this is a 21 year old male with a past medical history of syphilis as well as multiple sexual partners presenting with a 2-3 day history  of nausea vomiting abdominal pain diarrhea. Patient's no setting no significant nausea vomiting approximately 2 days ago abruptly progressed to abdominal pain diarrhea. Patient states that both emesis and diarrhea having had been somewhat bilious in nature. Patient's centralizes abdominal pain tip predominantly in the left lower quadrant. Patient also with a MAXIMUM TEMPERATURE of 103 on admission today. No rashes. Per the patient, he denies any illegal drug use. Patient does report recent unprotected sexual activity approximately 2 weeks ago. This was with a male partner. Per the patient, he denies any sick contacts. However, in further discussion with mother who was present at followup visit, she states that she had similar symptoms approximately 2-3 days prior to patient symptoms. Mother states her predominant symptoms were nausea abdominal pain and  diarrhea that minimally to mildly improved with Imodium. Patient states she still mildly symptomatic up until today even with Imodium although her symptoms are markedly improved. The patient states he's never had symptoms like this in the past. Patient denies any dysuria or hematuria. Patient denies any new medications.  Physical Exam:  Gen:  Pt close to cachectic and restless in bed.  Poorly cooperative. HEENT: mildly dry mucous membranes. Neck supple, no adenopathies. Pupils: miosis with minimal reaction to light. EOMI.  CV: Tachycardic, no murmurs rubs or gallops PULM: Clear to auscultation bilaterally. No wheezes/rales/rhonchi ABD: Soft, excavated, tender to palpation isolated on LLQ no guarding,  to normal bowel sounds.  EXT: No edema. Anal exam. No lesion on anal mucosa Neuro: Alert, agitated and oriented x3. No focalization. 5/5 strength on 4 extremities. No sensorial deficit. No cooperative to explore coordination. Gait not examined at this time. No meningeal signs present on examination.  Assessment and Plan:  This is a 21 year old male presenting with fever, abdominal pain, vomiting and diarrhea was also found to be hyponatremic with mildly altered mental status.  Gastrointestinal: Overall symptoms seem to be most consistent with a viral gastroenteritis given recent sick contact mother with similar symptoms. However, there are certain caveats we need to pay attention to including patient's risk for HIV given history of syphilis and unprotected sexual activity. There is also some concern for drug overdose. Will check CD4 count and HIV in addition to RPR, GC, Chlamydia. We'll check UDS. Plan to obtain a CT scan of the abdomen to further assess anatomy. Patient is also noted to be mildly thrombocytopenic with a platelet count of 134. In the setting of fever, mild renal dysfunction, as well as change of baseline mentation with an underlying GI illness there is concern for her TTP/HUS. Will  add on a peripheral smear to further assess this. Still has been sent off for C. difficile. We'll add on stool culture, fecal lactoferrin, stool ova and parasites. We'll also Hemoccult stools. Primary issues right now are rehydration, nausea and pain control, as well as abdominal imaging. When necessary morphine for pain. Zofran or Phenergan for nausea.  Renal: Noted serum sodium of 116 on admission. Clinically within the hypovolemic hyponatremia likely extra renal sources i.e. GI losses. Patient is status post 1 L normal saline bolus. We'll check a BMET after this. Overall goal for sodium repletion is for no greater than 10-12 mEq of sodium repletion in 24 hours which should be a sodium of 128 within 24 hours in this patient. Potassium 3.0 on admission. Likely secondary to GI losses. Will replete this via IV route currently.  Neuro: Patient is noted to be mildly agitated as well as getting out of bed against staff  wishes. Mom states this is a change in the patient's baseline. We'll check a UDS as well as serum alcohol and ammonia level. No asterixis on exam however patient is noted to have pinpoint pupils on ophthalmic evaluation which again may point to drug overdose. However, patient also has significant acute hyponatremia which may be underlying cause of the mental status changes. No meningitic signs on exam which is overall reassuring.  Cardiovascular: Noted tachycardia on admission likely secondary to hypovolemia. Hemodynamically stable from a blood pressure standpoint. Currently being rehydrated. Will continue clinically follow.  Infectious disease: Relatively broad differential for this as noted above. Patient does have monocytic predominance of this differential which leads likely more towards a viral etiology. However, patient does have secondary risk factors for underlying underlying chronic infectious disease such as HIV. CD4 count and HIV antibody. RPR, GC chlamydia. Currently no clinical  indications for antibiotics or antiviral medication.  Hematology: Noted thrombocytopenia with a platelet count of 134 and on presentation today. This is a new issue. Unsure of exact etiology of this. However, given GI illness in setting of fever neurological changes there is concern for TTP/HUS as described above. Overall diagnoses not clear-cut. There is no clinical indication for steroids or exchange transfusion currently. A peripheral smear has been ordered. This will help further delineate treatment plan. Will continue clinically follow. SCDs for DVT prophylaxis.  Disposition: Relatively broad differential workup. Pending clinical improvement.   Doree Albee MD  PGY-3 [phone]

## 2011-05-19 NOTE — Procedures (Signed)
CSF puncture at L4-5.  Clear CSF obtained with opening pressure 24 cm H20.  Procedure well tolerated without immediate complications.

## 2011-05-19 NOTE — ED Notes (Signed)
Call placed to the only phone number found on the pt's face sheet.  No family member able to be contacted.

## 2011-05-20 ENCOUNTER — Inpatient Hospital Stay (HOSPITAL_COMMUNITY): Payer: Medicaid Other

## 2011-05-20 DIAGNOSIS — A539 Syphilis, unspecified: Secondary | ICD-10-CM

## 2011-05-20 LAB — BASIC METABOLIC PANEL
CO2: 22 mEq/L (ref 19–32)
Calcium: 8.2 mg/dL — ABNORMAL LOW (ref 8.4–10.5)
Creatinine, Ser: 0.8 mg/dL (ref 0.50–1.35)
GFR calc Af Amer: >90 mL/min (ref >90)
Sodium: 126 mEq/L — ABNORMAL LOW (ref 135–145)

## 2011-05-20 LAB — CBC
Platelets: 110 10*3/uL — ABNORMAL LOW (ref 150–400)
RBC: 4.07 MIL/uL — ABNORMAL LOW (ref 4.22–5.81)
RDW: 12.5 % (ref 11.5–15.5)
WBC: 7.6 10*3/uL (ref 4.0–10.5)

## 2011-05-20 LAB — OCCULT BLOOD X 1 CARD TO LAB, STOOL: Fecal Occult Bld: POSITIVE

## 2011-05-20 LAB — FECAL LACTOFERRIN, QUANT: Fecal Lactoferrin: POSITIVE

## 2011-05-20 LAB — HEPATITIS B SURFACE ANTIBODY,QUALITATIVE: Hep B S Ab: POSITIVE — AB

## 2011-05-20 LAB — T.PALLIDUM AB, IGG: T pallidum Antibodies (TP-PA): >8 S/CO — ABNORMAL HIGH (ref <0.90)

## 2011-05-20 LAB — HEPATITIS C ANTIBODY: HCV Ab: NEGATIVE

## 2011-05-20 MED ORDER — POTASSIUM CHLORIDE 10 MEQ/100ML IV SOLN
10.0000 meq | INTRAVENOUS | Status: AC
  Administered 2011-05-20 (×2): 10 meq via INTRAVENOUS
  Filled 2011-05-20 (×2): qty 100

## 2011-05-20 MED ORDER — FLUCONAZOLE 200 MG PO TABS
400.0000 mg | ORAL_TABLET | Freq: Every day | ORAL | Status: DC
  Administered 2011-05-20 – 2011-05-23 (×4): 400 mg via ORAL
  Filled 2011-05-20 (×4): qty 2

## 2011-05-20 MED ORDER — CIPROFLOXACIN IN D5W 400 MG/200ML IV SOLN
400.0000 mg | Freq: Two times a day (BID) | INTRAVENOUS | Status: DC
  Administered 2011-05-20 – 2011-05-23 (×6): 400 mg via INTRAVENOUS
  Filled 2011-05-20 (×7): qty 200

## 2011-05-20 NOTE — Progress Notes (Signed)
PCP NOTE 5:35 PM Richard Rojas   Patient seen and examined. Agree with Inpatient team management.   Filed Vitals:   05/20/11 1201 05/20/11 1400 05/20/11 1509 05/20/11 1600  BP: 119/75 127/80 122/89 131/80  Pulse: 87 82 100 90  Temp: 98.6 F (37 C)   98.7 F (37.1 C)  TempSrc: Axillary   Axillary  Resp: 22 23 22 29   Height:      Weight:      SpO2: 100% 100% 100% 99%    PE: see Primary team note  A/P:  Problem list and management per primary team  1. Social visit Patient not able to communicate 2nd to AMS. Will check back later.  - Will follow with you  Thank you for the excellent care of this patient.  Edd Arbour MD

## 2011-05-20 NOTE — Progress Notes (Signed)
FMTS Attending Daily Note: Denny Levy MD [phone] pager office [phone] I  have seen and examined this patient, reviewed their chart. I have discussed this patient with the resident. I agree with the resident's findings, assessment and care plan. Appreciate ID consult and care. Will ask GI to consult re his esophageal thickening and his apparent colitis. ECHO ordered as he remains tachycardic.

## 2011-05-20 NOTE — Consult Note (Signed)
Infectious Diseases Initial Consultation  Reason for Consultation:  Possible new HIV, fever.    HPI: Richard Rojas is a 21 y.o. male with a history of multiple sexual partners, hx of STD, polysubstance abuse who originally presented with n/v/d at [facility].  Transferred to [facility] with FMTS.  CT showed colitis and the patient also has complained of headache, though history with him has been very difficulty for the primary team and with myself.  He only answers yes or no for the most part.  He appartently had diarrhea about 1 weeks.  No blood but is occult positive.  Also now, HIV Elisa is positive, though not yet confirmed.  + fever to 102.  No other complaints.   Past Medical History  Diagnosis Date  . Scoliosis     Allergies: No Known Allergies  Current antibiotics:   MEDICATIONS:    . ciprofloxacin  400 mg Intravenous Q12H  . fluconazole  400 mg Oral Daily  . metronidazole  500 mg Intravenous Q8H  . pantoprazole (PROTONIX) IV  40 mg Intravenous Q24H  . pneumococcal 23 valent vaccine  0.5 mL Intramuscular Tomorrow-1000  . potassium chloride  10 mEq Intravenous Q1 Hr x 2  . sodium chloride  1,000 mL Intravenous Once  . sodium chloride  1,000 mL Intravenous Once  . DISCONTD: cefTRIAXone (ROCEPHIN)  IV  2 g Intravenous Q12H  . DISCONTD: ciprofloxacin  400 mg Intravenous Q12H  . DISCONTD: fluconazole (DIFLUCAN) IV  400 mg Intravenous Q24H  . DISCONTD: ganciclovir (CYTOVENE) IV  5 mg/kg (Order-Specific) Intravenous Q12H  . DISCONTD: piperacillin-tazobactam (ZOSYN)  IV  3.375 g Intravenous Q8H  . DISCONTD: vancomycin  750 mg Intravenous Q8H  . DISCONTD: vancomycin  1,000 mg Intravenous Q8H    History  Substance Use Topics  . Smoking status: Current Some Day Smoker    Types: Cigarettes  . Smokeless tobacco: Not on file  . Alcohol Use: No    History reviewed. No pertinent family history.  Review of Systems - Negative except as per HPI.    OBJECTIVE: Temp:  [98.6 F (37 C)-102.9  F (39.4 C)] 98.6 F (37 C) (03/05 1201) Pulse Rate:  [82-131] 82  (03/05 1400) Resp:  [19-33] 23  (03/05 1400) BP: (96-131)/(42-80) 127/80 mmHg (03/05 1400) SpO2:  [95 %-100 %] 100 % (03/05 1400) Weight:  [147 lb 7.8 oz (66.9 kg)-147 lb 11.3 oz (67 kg)] 147 lb 11.3 oz (67 kg) (03/04 2200) General appearance: alert, no distress and not overly cooperative with exam. Neck: supple, symmetrical, trachea midline and no meningismus signs Resp: clear to auscultation bilaterally Cardio: regular rate and rhythm, S1, S2 normal, no murmur, click, rub or gallop GI: soft, mild tenderness on right side, soft, + normoactive bowel sounds Male genitalia: normal, penis: no lesions or discharge. testes: no masses or tenderness. no hernias Extremities: extremities normal, atraumatic, no cyanosis or edema Skin: Skin color, texture, turgor normal. No rashes or lesions  LABS:   MICRO:  IMAGING: Ct Head W Wo Contrast  05/19/2011  *RADIOLOGY REPORT*  Clinical Data: Headache, fever and mycosis.  CT HEAD WITHOUT AND WITH CONTRAST  Technique:  Contiguous axial images were obtained from the base of the skull through the vertex without and with intravenous contrast.  Contrast:  80 ml Omnipaque-300  Comparison: None.  Findings: No acute intracranial abnormalities.  Specifically, no evidence of intracranial mass, mass effect, hydrocephalus, acute infarction, or abnormal intra or extra-axial fluid collections.  No abnormal areas of enhancement on  postcontrast images.  No displaced skull fractures.  Visualized paranasal sinuses and mastoids are generally well pneumatized.  IMPRESSION: 1.  No acute intracranial abnormalities. 2.  Normal appearance of the brain.  Original Report Authenticated By: Florencia Reasons, M.D.   Ct Abdomen Pelvis W Contrast  05/19/2011  *RADIOLOGY REPORT*  Clinical Data: Left lower quadrant pain, fever, vomiting and diarrhea.  CT ABDOMEN AND PELVIS WITH CONTRAST  Technique:  Multidetector CT imaging  of the abdomen and pelvis was performed following the standard protocol during bolus administration of intravenous contrast.  Contrast:  80 ml Omnipaque-300.  Comparison: 10/12/2008.  Findings: Streak artifact caused by hardware utilized for scoliosis surgery.  Diffuse inflammation of the colon.  Question inflammatory versus infectious in origin.  Ischemia not excluded however, this is felt to be less likely consideration.  Celiac axis, superior mesenteric artery and inferior mesenteric artery are patent.  No free intraperitoneal air. Mild infiltration of fat planes most notable in the pelvic region without drainable abscess noted.  Thickened appearance of the distal esophagus with mild infiltration of surrounding fat planes.  Question result of mild esophagitis.  Taking into account the significant streak artifact from spinal hardware, no focal liver, splenic, pancreatic, adrenal or renal lesion.  No calcified gallstones.  Noncontrast filled views of the urinary bladder unremarkable. Scattered normal/top normal sized lymph nodes mesenteric and retroperitoneal region.  IMPRESSION: Diffuse inflammation of the colon.  Question inflammatory versus infectious in origin.  Thickened appearance of the distal esophagus with mild infiltration of surrounding fat planes.  Question result of mild esophagitis.  Original Report Authenticated By: Fuller Canada, M.D.   Dg Fluoro Guide Lumbar Puncture  05/19/2011  Clinical Data: Rule out cryptococcal meningitis;  HIV POSITIVE  LUMBAR PUNCTURE FLUORO GUIDE  Comparison: CT abdomen and pelvis from 05/19/2011 was reviewed. Head CT from 05/19/2011 was reviewed.  Findings:  I discussed the risks, benefits, and alternatives to lumbar puncture with the patient prior to the procedure. Emphasized risks included bleeding, infection, and headache. Epidural hematoma requiring surgical evacuation was discussed.  The patient voiced understanding and wished to proceed.  Written informed consent  was obtained on the floor prior to the patient coming to radiology.  However, I personally reviewed the procedure with the patient before beginning. He was given an opportunity to ask questions and all questions were answered.  Using fluoroscopic guidance, an appropriate skin site was identified at the midline back.  This area of skin was prepped and draped in the usual sterile fashion.  Anesthesia of the skin and deeper soft tissues was obtained using 1% lidocaine without epinephrine.  Using a standard 20 gauge spinal needle, the CSF space was punctured at the L5-5 level on the the second pass. Clear CSF was obtained at this level. Opening pressure was 24 cm H2O.  A total of approximately 11 ml of clear CSF was obtained across four separate tubes.  IMPRESSION: Successful fluoro guided lumbar puncture.  CSF aliquots were sent to the laboratory for appropriate analysis.  The procedure was well tolerated without evidence for immediate complications.  Original Report Authenticated By: ERIC A. MANSELL, M.D.    HISTORICAL MICRO/IMAGING  Assessment/Plan:  21 yo with colitis. 1) colitis - unclear etiology, c diff negative, no recent antibiotics that I can tell.  On flagyl, I added back cipro.  Will monitor for improvement.   2) Headache - LP negative for meningitis with only 2 WBCs.  No focal signs and so I have stopped the antibiotics.  3) Fever - likely from #1, but also considering MAC (though less likely with CD 4 > 50, PCP pneumonia.  Will check CXR as he did have elevated RR, though clinically not c/w pneumonia.   4) HIV - It has not yet been confirmed with Western Blot but CD 4 is 190.  Will await until confirmation with the viral load, which will be back prior to the Western Blot.  He will then need PCP prophylaxis with Bactrim 1 DS daily.  Other labs have been sent off.

## 2011-05-20 NOTE — Progress Notes (Signed)
05-20-11 UR completed. Kiegan Macaraeg RN BSN  

## 2011-05-20 NOTE — Progress Notes (Signed)
Patient ID: Richard Rojas, male   DOB: Jan 15, 1991, 21 y.o.   MRN: [id number] PGY-1 Daily Progress Note Family Medicine Teaching Service D. Piloto Rolene Arbour, MD Service Pager: [phone]  Patient name: Richard Rojas  Medical record [id number] Date of birth:[birth year] Age: 21 y.o. Gender: male  LOS: 2 days   Subjective: non verbal to [place] but alert. Has had more diarrheas and nursing reports coffee ground-like emesis. No reported nausea per nursing.  Objective: Vitals: Temp:  [99.1 F (37.3 C)-102.9 F (39.4 C)] 99.1 F (37.3 C) (03/05 0800) Pulse Rate:  [103-136] 120  (03/05 0800) Resp:  [19-33] 19  (03/05 0800) BP: (96-131)/(42-78) 106/60 mmHg (03/05 0800) SpO2:  [95 %-100 %] 100 % (03/05 0800) Weight:  [147 lb 7.8 oz (66.9 kg)-147 lb 11.3 oz (67 kg)] 147 lb 11.3 oz (67 kg) (03/04 2200)  Intake/Output Summary (Last 24 hours) at 05/20/11 1137 Last data filed at 05/20/11 1057  Gross per 24 hour  Intake 2570.75 ml  Output   1728 ml  Net 842.75 ml   Physical Exam: Gen: Pt cachectic NAD in bed. Poorly cooperative. Alert. HEENT: mildly dry mucous membranes. Neck supple, no adenopathies.  Pupils: pt does not cooperate with Physical exam.  CV: Tachycardic, no murmurs rubs or gallops  PULM: Clear to auscultation bilaterally. No wheezes/rales/rhonchi  ABD: Soft, excavated pt was on lateral decubitus and did not let us exam his abdomen. EXT: No edema.  Anal exam. No lesion on anal mucosa  Neuro: Alert, non cooperative to evaluate orientation. Moving 4 extremities. No cooperative to explore coordination. Gait not examined at this time.    Labs and imaging:  CBC  Lab 05/20/11 0423 05/19/11 0600 05/19/11 0015  WBC 7.6 4.4 5.5  HGB 12.4* 14.5 16.8  HCT 33.8* 40.5 45.7  PLT 110* 120* --   BMET  Lab 05/20/11 0423 05/19/11 2211 05/19/11 1330  NA 126* 123* 122*  K 3.1* 3.6 3.4*  CL 94* 90* 88*  CO2 22 22 25   BUN 6 6 9   CREATININE 0.80 0.84 0.84  LABGLOM -- -- --  GLUCOSE  90 -- --  CALCIUM 8.2* 8.0* 8.3*   Results for orders placed during the hospital encounter of 05/18/11 (from the past 24 hour(s))  LACTATE DEHYDROGENASE     Status: Normal   Collection Time   05/19/11  1:30 PM      Component Value Range   LD 220  94 - 250 (U/L)  OCCULT BLOOD X 1 CARD TO LAB, STOOL     Status: Normal   Collection Time   05/19/11  5:25 PM      Component Value Range   Fecal Occult Bld POSITIVE    CRYPTOCOCCAL ANTIGEN, CSF     Status: Normal   Collection Time   05/19/11  8:00 PM      Component Value Range   Crypto Ag NEGATIVE  NEGATIVE    Cryptococcal Ag Titer NOT INDICATED  NOT INDICATED   PROTEIN AND GLUCOSE, CSF     Status: Abnormal   Collection Time   05/19/11  8:00 PM      Component Value Range   Glucose, CSF 57  43 - 76 (mg/dL)   Total  Protein, CSF 56 (*) 15 - 45 (mg/dL)  GRAM STAIN     Status: Normal   Collection Time   05/19/11  8:00 PM      Component Value Range   Specimen Description CSF  Special Requests TUBE 2 @ 2CC     Gram Stain       Value: CYTOSPUN     WBC PRESENT,BOTH PMN AND MONONUCLEAR     NO ORGANISMS SEEN   Report Status 05/19/2011 FINAL    CSF CELL COUNT WITH DIFFERENTIAL     Status: Abnormal   Collection Time   05/19/11  8:00 PM      Component Value Range   Tube # 3     Color, CSF COLORLESS  COLORLESS    Appearance, CSF CLEAR  CLEAR    Supernatant NOT INDICATED     RBC Count, CSF 106 (*) 0 (/cu mm)   WBC, CSF 2  0 - 5 (/cu mm)   Lymphs, CSF RARE  40 - 80 (%)   Monocyte-Macrophage-Spinal Fluid RARE  15 - 45 (%)   Other Cells, CSF TOO FEW TO COUNT, SMEAR AVAILABLE FOR REVIEW    CSF CULTURE     Status: Normal (Preliminary result)   Collection Time   05/19/11  8:00 PM      Component Value Range   Specimen Description CSF     Special Requests NO 2 2CC     Gram Stain       Value: CYTOSPIN WBC PRESENT,BOTH PMN AND MONONUCLEAR     NO ORGANISMS SEEN     Performed at [facility]   Culture PENDING     Report Status PENDING    OCCULT  BLOOD X 1 CARD TO LAB, STOOL     Status: Normal   Collection Time   05/20/11  7:49 AM      Component Value Range   Fecal Occult Bld POSITIVE     Ct Head W Wo Contrast 05/19/2011   IMPRESSION: 1.  No acute intracranial abnormalities. 2.  Normal appearance of the brain.  Original Report Authenticated By: Richard Rojas, M.D.   Ct Abdomen Pelvis W Contrast 05/19/2011 IMPRESSION: Diffuse inflammation of the colon.  Question inflammatory versus infectious in origin.  Thickened appearance of the distal esophagus with mild infiltration of surrounding fat planes.  Question result of mild esophagitis.  Original Report Authenticated By: Richard Rojas, M.D.   Medications: Scheduled Meds:   . cefTRIAXone (ROCEPHIN)  IV  2 g Intravenous Q12H  . fluconazole (DIFLUCAN) IV  400 mg Intravenous Q24H  . ganciclovir (CYTOVENE) IV  5 mg/kg (Order-Specific) Intravenous Q12H  . metronidazole  500 mg Intravenous Q8H  . pantoprazole (PROTONIX) IV  40 mg Intravenous Q24H  . pneumococcal 23 valent vaccine  0.5 mL Intramuscular Tomorrow-1000  . potassium chloride  10 mEq Intravenous Q1 Hr x 4  . potassium chloride  10 mEq Intravenous Q1 Hr x 2  . sodium chloride  1,000 mL Intravenous Once  . sodium chloride  1,000 mL Intravenous Once  . vancomycin  1,000 mg Intravenous Q8H  . DISCONTD: ciprofloxacin  400 mg Intravenous Q12H  . DISCONTD: piperacillin-tazobactam (ZOSYN)  IV  3.375 g Intravenous Q8H  . DISCONTD: vancomycin  750 mg Intravenous Q8H   Continuous Infusions:   . sodium chloride 1,000 mL (05/20/11 1118)   PRN Meds:.morphine injection, ondansetron (ZOFRAN) IV, promethazine, DISCONTD: ondansetron (ZOFRAN) IV, DISCONTD: promethazine  Assessment and Plan: Richard Rojas is a 20 y.o.admitted for  fever, nausea, vomiting, diarrhea and altered mental status. 1. New diagnosis of  HIV positive: Pending CD4 count. Appreciate ID consult Dr. Ninetta Lights and will f/u recommendations. -Pt on Ceftriaxone,  Vancomycin, Metronidazole, Ganciclovir and Fluconazole. -  Positive RPR with titer 1:4 that has improved since last 1:8. [facility]  -Negative GC and Chlamydia.  2. Fever, nausea, vomiting and diarrhea: Pt continuous to have fever, yesterday 102.9 and was decided to perform LP. CSF results are negative for Cryptococcus but it is positive for increase protein and cells (Monocytes and PMN) equivocal but pointing more toward viral etiology.   3. Altered mental status: Non cooperative. Nursing reports that pt had incoherent speech. Could be secondary to hyponatremia, and eventhough this has not resolved completely is improved since admission. We think it had more to do with CSN involvement.  - UDS positive for THC and opioids(pt received a dose of morphine before urine was collected)  4. Electrolytes disbalances: hyponatremia and hypokalemia we think could be secondary to intractable vomiting and diarrhea.  -IV fluids bolus and then continuous with 200 ml/h  -K repletion as needed.  5. Ocult fecal blood positive and per nursing coffee ground-like emesis. Pt w/ diagnosis of colitis and esophagitis per CT. Negative Cdiff. -Pt will benefit for GI consult.  6. Acute renal failure: Cr elevated 1.06 on admission, baseline on 0.5. We think was secondary to dehydration, after intensive rehydration therapy pt creatinine is 0.80. Continue to monitor.  7. Thombocytopenia: platelet count of continues to drop 110 today. Mixed labs results: D dimer elevated but fibrinogen elevated goes against dx of DIC at this time. No schistocytes but presence of large platelets on peripheral smear possible increase destruction secondary to infection (HIV)? Normal Hb and LDH favors non intravascular hemolysis.  FEN/GI: IVF NS 200 ml/h Prophylaxis: SCD's   Disposition: Pending improvement.  D. Piloto Rolene Arbour, MD PGY1, [facility] Medicine Teaching Service Pager [phone] 05/20/2011

## 2011-05-20 NOTE — Progress Notes (Signed)
  Family Medicine Teaching Service Daily Resident Note LATE ENTRY   Patient name: ELENO WEIMAR Medical record number: [id number] Date of birth: [birth year] Age: 21 y.o. Gender: male Length of Stay:  LOS: 2 days     Pt had persistent fevers, and intractable vomiting and diarrhea throughout the day on 3/4.  He spiked a TMax of 102.9.  HIV Ab screen was + and concern was raised for HIV Defining illness.  Due to this ID consulted @ 700PM and discussed case with Dr. Ninetta Lights.  ABX broadened to IV VANCO, ROCEPHIN Men Dosing, and Flagyl.  Ganicyclovir added for CMV coverage.  IV Fluconazole added for concerns of systemic fungal infection and LP by IR to rule out cryptococcus.  If cryptococcus + need for Amphotericin.  Fluid increased to 200Ml/hr of NS as Na trending stable in 120s.   Continue IV NS boluses prn. Zofran and Phenergan for vomiting; no tx for diarrhea as likely infectious source.  Pt updated regarding situation and voices understanding.        Gaspar Bidding, DO [facility] Resident - PGY-1 05/20/2011 7:33 AM

## 2011-05-21 ENCOUNTER — Encounter (HOSPITAL_COMMUNITY): Payer: Self-pay | Admitting: Physician Assistant

## 2011-05-21 DIAGNOSIS — R197 Diarrhea, unspecified: Secondary | ICD-10-CM

## 2011-05-21 DIAGNOSIS — R933 Abnormal findings on diagnostic imaging of other parts of digestive tract: Secondary | ICD-10-CM

## 2011-05-21 DIAGNOSIS — R195 Other fecal abnormalities: Secondary | ICD-10-CM

## 2011-05-21 DIAGNOSIS — K219 Gastro-esophageal reflux disease without esophagitis: Secondary | ICD-10-CM

## 2011-05-21 DIAGNOSIS — R000 Tachycardia, unspecified: Secondary | ICD-10-CM

## 2011-05-21 LAB — EHEC TOXIN BY EIA, STOOL: EHEC Toxin by EIA: NEGATIVE

## 2011-05-21 LAB — CBC
HCT: 32.4 % — ABNORMAL LOW (ref 39.0–52.0)
Platelets: 146 10*3/uL — ABNORMAL LOW (ref 150–400)
RBC: 3.93 MIL/uL — ABNORMAL LOW (ref 4.22–5.81)
RDW: 12.6 % (ref 11.5–15.5)
WBC: 9.9 10*3/uL (ref 4.0–10.5)

## 2011-05-21 LAB — MAGNESIUM: Magnesium: 2 mg/dL (ref 1.5–2.5)

## 2011-05-21 LAB — BASIC METABOLIC PANEL
BUN: 5 mg/dL — ABNORMAL LOW (ref 6–23)
CO2: 21 mEq/L (ref 19–32)
Chloride: 101 mEq/L (ref 96–112)
Chloride: 100 mEq/L (ref 96–112)
GFR calc Af Amer: >90 mL/min (ref >90)
GFR calc Af Amer: >90 mL/min (ref >90)
Potassium: 2.8 mEq/L — ABNORMAL LOW (ref 3.5–5.1)
Potassium: 3 mEq/L — ABNORMAL LOW (ref 3.5–5.1)

## 2011-05-21 LAB — HEPATITIS A ANTIBODY, IGM: Hep A IgM: NEGATIVE

## 2011-05-21 MED ORDER — DEXTROSE-NACL 5-0.45 % IV SOLN
INTRAVENOUS | Status: DC
  Administered 2011-05-21: 1000 mL via INTRAVENOUS
  Administered 2011-05-22 – 2011-05-23 (×3): via INTRAVENOUS

## 2011-05-21 MED ORDER — POTASSIUM CHLORIDE 10 MEQ/100ML IV SOLN
10.0000 meq | INTRAVENOUS | Status: AC
  Administered 2011-05-21 (×6): 10 meq via INTRAVENOUS
  Filled 2011-05-21 (×6): qty 100

## 2011-05-21 MED ORDER — POTASSIUM CHLORIDE 10 MEQ/100ML IV SOLN
10.0000 meq | INTRAVENOUS | Status: AC
  Administered 2011-05-21 (×4): 10 meq via INTRAVENOUS
  Filled 2011-05-21: qty 300

## 2011-05-21 MED ORDER — POTASSIUM CHLORIDE 10 MEQ/100ML IV SOLN
INTRAVENOUS | Status: AC
  Filled 2011-05-21: qty 100

## 2011-05-21 NOTE — Progress Notes (Signed)
FMTS Attending Daily Note: Shantia Sanford MD [phone] pager office [phone] I  have seen and examined this patient, reviewed their chart. I have discussed this patient with the resident. I agree with the resident's findings, assessment and care plan. 

## 2011-05-21 NOTE — Progress Notes (Addendum)
Order to transfer pt to Med surg.  Bed available on 3000.  Pt and grandmother at bedside, informed of transfer.  All belongings packed.  Report called to [place] on 3000.  Pt to be transferred in bed with all belongings.  Pt transferred at 1455hrs via bed without problems.  Kim RN notified pt arrived on floor.

## 2011-05-21 NOTE — Progress Notes (Signed)
Patient ID: Richard Rojas, male   DOB: February 28, 1991, 21 y.o.   MRN: [id number] PGY-1 Daily Progress Note Family Medicine Teaching Service D. Piloto Rolene Arbour, MD Service Pager: [phone]  Patient name: Richard Rojas  Medical record [id number] Date of birth:1990-07-11 Age: 21 y.o. Gender: male  LOS: 3 days   Subjective: non verbal and with his eyes closed to [place] but alert. Nursing reports ordinary and grumpy behavior. No diarrhea in 24 hours. Continue with clear emesis.   Objective: Vitals: Temp:  99.1 F (37.3 C) (03/06 0441) Pulse Rate: 81  (03/06 0441) Resp:21  (03/06 0441) BP:  128/64 mmHg (03/06 0441) SpO2: 95 % (03/06 0441) Weight: 47 lb 14.9 oz (67.1 kg) (03/06 0441)  Intake/Output Summary (Last 24 hours) at 05/21/11 0700 Last data filed at 05/21/11 0600  Gross per 24 hour  Intake   5980 ml  Output   2225 ml  Net   3755 ml   Physical Exam: Vitals reviwed and stable last fever more than 24h ago.  Gen: Pt cachectic NAD in bed. Poorly cooperative. Alert HEENT: Pupils: pt does not cooperate with Physical exam.  CV: RRR, no murmurs rubs or gallops  PULM: Clear to auscultation bilaterally. No wheezes/rales/rhonchi  ABD: Soft, excavated pt was on lateral decubitus and did not let us exam his abdomen. EXT: No edema.  Neuro: Alert, non cooperative to evaluate orientation. Moving 4 extremities. No cooperative to explore coordination. Gait not examined at this time.   Labs and imaging:  CBC  Lab 05/20/11 0423 05/19/11 0600 05/19/11 0015  WBC 7.6 4.4 5.5  HGB 12.4* 14.5 16.8  HCT 33.8* 40.5 45.7  PLT 110* 120* --   BMET  Lab 05/21/11 0415 05/20/11 0423 05/19/11 2211  NA 132* 126* 123*  K 2.8* 3.1* 3.6  CL 101 94* 90*  CO2 21 22 22   BUN 5* 6 6  CREATININE 0.69 0.80 0.84  LABGLOM -- -- --  GLUCOSE 108* -- --  CALCIUM 7.9* 8.2* 8.0*     Medications: Scheduled Meds:   . ciprofloxacin  400 mg Intravenous Q12H  . fluconazole  400 mg Oral Daily  .  metronidazole  500 mg Intravenous Q8H  . pantoprazole (PROTONIX) IV  40 mg Intravenous Q24H  . pneumococcal 23 valent vaccine  0.5 mL Intramuscular Tomorrow-1000  . potassium chloride  10 mEq Intravenous Q1 Hr x 2  . potassium chloride  10 mEq Intravenous Q1 Hr x 4   Continuous Infusions:   . sodium chloride 200 mL/hr at 05/21/11 0039   PRN Meds:.morphine injection, ondansetron (ZOFRAN) IV, promethazine   Assessment and Plan: Richard Rojas is a 21 y.o.admitted for  fever, nausea, vomiting, diarrhea and altered mental status.  1. HIV antigen positive: pending confirmation. CD4 count 190 . Appreciate ID consult and will f/u recommendations.   -Pt on Cipro, Metronidazole and Fluconazole.  2. Fever, nausea, vomiting and diarrhea: Pt afebrile for 24 h. Diarrhea.    Negative for Hep B or C. Neg for ova or parasite. Hep A pending results.  3. Altered mental status: Non cooperative but alert.   4. Electrolytes disbalances: hyponatremia that has improved since admission and hypokalemia secondary to intractable vomiting. -IV fluids continuous reduced to 156ml/h possible iniciating D5 since pt is not tolerating PO. Nutrition consult. -phenergan and zofran alt -K repletion as needed. ( 4 runs of 10 meq today) recheck.  5. Ocult fecal blood positive, lactoferrin positive. Pt w/ diagnosis of colitis and esophagitis  per CT.  Negative Cdiff. -Pt will benefit for GI consult, pending.  6. Acute renal failure: resolved  7. Thombocytopenia: platelet count of improved today.  FEN/GI: IVF NS 175 ml/h. Prophylaxis: SCD's   Disposition: Possible transfer to regular floor today.  D. Pil[place] Rolene Arbour, MD PGY1, [facility] Medicine Teaching Service Pager [phone] 05/21/2011

## 2011-05-21 NOTE — Consult Note (Signed)
[facility] Consult: 12:43 PM 05/21/2011   Referring Provider: Burnard Leigh MD  Primary Care Physician:  Edd Arbour, MD Primary Gastroenterologist:  None, unassigned.  Reason for Consultation:  Thickened esophagus and pan-colonic inflammation on CT scan  HPI:  Richard Rojas is a 21 y.o. male.  His history includes placement of Harrington rod to the spine to correct scoliosis, syphilis last summer with pt non-compliant with follow up. On March 3 patient presented to hospital  ED with fever of 103 and 2 day history of nausea, vomiting and diarrhea. The emesis was non-bloody but bilious. Presenting sodium was 116, potassium 3.0. Mental status was altered with patient displaying agitation and confusion.  CT scan of the abdomen confirmed diffuse colonic inflammation as well as thickening of the distal esophagus suggestive of mild esophagitis.  He was started on vancomycin IV and Zosyn.  Patient was soon found to be HIV screening and Elisa positive, CD 4 count is 190. Antibiotic coverage broadened with the addition of Rocephin, Flagyl, ganciclovir and IV fluconazole. Patient underwent lumbar puncture, initial stains showing presence of white blood cells but no organisms.  Dr. Luciana Axe from infectious disease has further adjusted the antibiotic regimen,  which now includes IV Cipro, oral fluconazole, IV metronidazole,  He states there is no evidence for meningitis.  Risk factors for HIV  include multiple unprotected sexual partners and polysubstance abuse.   On hospital day 3 coffee-ground emesis was reported.  Fecal occult blood testing was positive.  C diff by PCR is negative.  Initially the patient was complaining of some left lower quadrant pain which was not severe, however this is resolved.  Patient's diarrhea has improved. He had no stools yesterday nor today. Stools have not been grossly bloody. He still has nausea. The nurse says the emesis is very small volume  and clear as of yesterday but he is still not tolerating clears including water with his po meds.  He denies odyno/dysphagia.  GI consultation was sought to evaluate these problems.  An echocardiogram has also been ordered as the patient has been tachycardic.   Past Medical History  Diagnosis Date  . Scoliosis   . Syphilis 2012, june    not clear he completed treatment.  non-compliant with follow up.    Past Surgical History  Procedure Date  . Spine surgery     harrington rod placement for scoliosis  . Back surgery     Prior to Admission medications   Not on File    Scheduled Meds:    . ciprofloxacin  400 mg Intravenous Q12H  . fluconazole  400 mg Oral Daily  . metronidazole  500 mg Intravenous Q8H  . pantoprazole (PROTONIX) IV  40 mg Intravenous Q24H  . potassium chloride  10 mEq Intravenous Q1 Hr x 4  . potassium chloride       Infusions:    . dextrose 5 % and 0.45% NaCl 1,000 mL (05/21/11 1040)  . DISCONTD: sodium chloride 1,000 mL (05/21/11 0930)   PRN Meds: morphine injection, ondansetron (ZOFRAN) IV, promethazine   Allergies as of 05/18/2011  . (No Known Allergies)    History reviewed. No pertinent family history.  History   Social History  . Marital Status: Single    Spouse Name: N/A    Number of Children: N/A  . Years of Education: N/A   Occupational History  . Not on file.  Patient says he was enrolled at [employer], studying                                                                     business but that he dropped out   Social History Main Topics  . Smoking status: Current Some Day Smoker, smokes about 7 cigarettes daily    Types: Cigarettes  . Smokeless tobacco: Not on file  . Alcohol Use: No  . Drug Use: Admits to smoking marijuana occasionally. Denies intravenous drug use and use of any other illicit drugs.  . Sexually Active: Yes    Birth Control/ Protection: None   Other  Topics Concern  . Not on file   Social History Narrative  . No narrative on file                                        Lives at home with his mother and sister      REVIEW OF SYSTEMS: Constitutional:  Denies weight. Cities always been thin " didn't you read my chart? ENT:  No nasal discharge, no epistaxis Pulm:  No cough, no dyspnea  CV:  No chest pain, no palpitations, no extremity edema GU:  No penile discharge, no hematuria no dysuria GI:  No dysphagia, no previous GI symptoms other than those that developed 2 days or so before admission Heme:  No unusual bleeding, bruising. No swollen lymph nodes    Transfusions:  none Neuro:  Headache. Feels dizzy when he stands up. No history of seizures Derm:  No rash, itching or nonhealing sores.  Has many tattoos Endocrine:  No excessive thirst, excessive urination. No hair loss Immunization:  Received pneumococcal vaccine during this admission.  Does not recall if he received Hep B vaccination. Travel:  None beyond the local region   PHYSICAL EXAM: Vital signs in last 24 hours: Temp:  [98.4 F (36.9 C)-99.6 F (37.6 C)] 98.9 F (37.2 C) (03/06 1207) Pulse Rate:  [78-100] 81  (03/06 1207) Resp:  [19-29] 19  (03/06 1207) BP: (113-139)/(63-89) 126/81 mmHg (03/06 1207) SpO2:  [95 %-100 %] 99 % (03/06 1207) Weight:  [147 lb 14.9 oz (67.1 kg)] 147 lb 14.9 oz (67.1 kg) (03/06 0441)  General: Thin, but not cachectic-appearing African American man. Disengaged with interview process. It does not look interviewer in the eye and provides mostly yes and no answers Head:  Normocephalic, atraumatic  Eyes:  No scleral icterus, no conjunctival pallor Ears:  Patient seems to be listening but doesn't always answer, however I don't think this is due to hearing deficiency rather this is due to his disinterest  Nose:  No discharge, no congestive sounds of breathing Mouth:  Oropharynx pink, moist, clear. Did not get adequate visualization of pharynx as  the patient did not open mouth wide. Neck:  No adenopathy, no JVD, no masses. Lungs:  Poor inspiratory effort but lungs are clear. No cough, no dyspnea at rest Heart: regular rate and rhythm, no murmurs/rubs/gallops Abdomen:  Soft, thin, active bowel sounds, nontender. No HSM.   Rectal: deferred   Musc/Skeltl: no joint swelling or gross deformity Extremities:  No pedal edema  Neurologic:  No tremor, moves all fours easily. Alert and oriented x3. Skin:  No rash, sores. Tattoos:  Multiple tattoos on all limbs and trunk Psych:  The patient is disengaged, not clear whether this represents depression or is his normal mode of behavior  Intake/Output from previous day: 03/05 0701 - 03/06 0700 In: 6180 [P.O.:720; I.V.:4400; IV Piggyback:1060] Out: 2225 [Urine:2025; Emesis/NG output:200] Intake/Output this shift: Total I/O In: 1260 [I.V.:750; IV Piggyback:510] Out: 650 [Urine:650]  LAB RESULTS:  Basename 05/21/11 0415 05/20/11 0423 05/19/11 1043 05/19/11 0600  WBC 9.9 7.6 -- 4.4  HGB 11.9* 12.4* -- 14.5  HCT 32.4* 33.8* -- 40.5  PLT 146* 110* 125* --   BMET Lab Results  Component Value Date   NA 132* 05/21/2011   NA 126* 05/20/2011   NA 123* 05/19/2011   K 2.8* 05/21/2011   K 3.1* 05/20/2011   K 3.6 05/19/2011   CL 101 05/21/2011   CL 94* 05/20/2011   CL 90* 05/19/2011   CO2 21 05/21/2011   CO2 22 05/20/2011   CO2 22 05/19/2011   GLUCOSE 108* 05/21/2011   GLUCOSE 90 05/20/2011   GLUCOSE 85 05/19/2011   BUN 5* 05/21/2011   BUN 6 05/20/2011   BUN 6 05/19/2011   CREATININE 0.69 05/21/2011   CREATININE 0.80 05/20/2011   CREATININE 0.84 05/19/2011   CALCIUM 7.9* 05/21/2011   CALCIUM 8.2* 05/20/2011   CALCIUM 8.0* 05/19/2011   LFT  Basename 05/19/11 0600  PROT 5.9*  ALBUMIN 2.9*  AST 31  ALT 13  ALKPHOS 55  BILITOT 1.6*  BILIDIR --  IBILI --   PT/INR Lab Results  Component Value Date   INR 1.38 05/19/2011   Hepatitis Panel  Basename 05/20/11 1352  HEPBSAG NEGATIVE  HCVAB NEGATIVE  HEPAIGM NEGATIVE    HEPBIGM --   C-Diff Negative PCR on 05/19/11 Fecal Lactoferrin positive  05/19/11 Ova/Parasites negative  05/19/11   Drugs of Abuse     Component Value Date/Time   LABOPIA POSITIVE* 05/19/2011 0808   COCAINSCRNUR NONE DETECTED 05/19/2011 0808   LABBENZ NONE DETECTED 05/19/2011 0808   AMPHETMU NONE DETECTED 05/19/2011 0808   THCU POSITIVE* 05/19/2011 0808   LABBARB NONE DETECTED 05/19/2011 0808     RADIOLOGY STUDIES: Dg Chest 2 View  05/20/2011  *RADIOLOGY REPORT*  Clinical Data: Evaluate for pneumonia  CHEST - 2 VIEW  Comparison: 10/12/2008  Findings:  Heart size appears normal.  There is no pleural effusion or pulmonary interstitial edema.  No airspace consolidation identified.  Scoliosis rods are identified within the thoracic and lumbar spine.  IMPRESSION:  1. No evidence for pneumonia.  Original Report Authenticated By: Rosealee Albee, M.D.   05/19/11  CT ABDOMEN/PELVIS W CONTRAST. IMPRESSION:  Diffuse inflammation of the colon. Question inflammatory versus  infectious in origin.  Thickened appearance of the distal esophagus with mild infiltration  of surrounding fat planes. Question result of mild esophagitis.  Original Report Authenticated By: Fuller Canada, M.D.  05/19/11    CT HEAD IMPRESSION:  1. No acute intracranial abnormalities.  2. Normal appearance of the brain.  Original Report Authenticated By: Florencia Reasons, M.D.   ENDOSCOPIC STUDIES: None ever  IMPRESSION: 1. Colitis.  Heme positive but given his new diagnosis of HIV and acute presentation, opportunistic infectious source is most likely cause. Norvirus possible.  Diarrhea has greatly improved.  2.  Esophagitis by CT with patient having acute nausea/vomiting at presentation, again this may be primary infectious esophagitis. It also may  be due to nausea vomiting secondary to his colitis.  Coffee ground emesis has resolved and is not surprising in pt with protracted vomitting.  At present patient is being well covered  for a broad spectrum of infectious etiologies with current regimen of IV Cipro, oral fluconazole IV metronidazole. IV Protonix in place for acid peptic coverage. 3. New diagnosis of AIDS.   Please be aware that no family members are aware of this diagnoses so be careful when speaking to pt if visitors present.  4.  Hepatitis B surface Ab positive. Pt not sure if he was vaccinated, given his age it's likely he received Hep B vaccine.  Transaminases are normal, total bilirubin mildly elevated.   5.  Thrombocytopenia.   PLAN: 1.  Continue current antimicrobial regimen, which includes good coverage for candidial esophagitis, and common enteric pathogens.  2.  No plans for EGD or colon/flex at present.     LOS: 3 days   Jennye Moccasin  05/21/2011, 12:43 PM Pager: [phone]  GI ATTENDING  HX, LABS, X-RAYS REVIEWED. PATIENT SEEN AND EXAMINED. AGREE WITH H&P AS ABOVE. MINIMAL COOPERATION WITH HISTORY. HAD PHARYNGEAL DISCOMFORT BUT DENIES ODYNOPHAGIA. DIARRHEA HAS RESOLVED. EXAM BENIGN. XRAY FINDINGS NOT OVERWHELMING. AGREE WITH PPI AND DIFLUCAN FOR ESOPHAGEAL CONDITIONS AND CIPRO / FLAGYL FOR POSSIBLE ENTERIC CONDITIONS. OTHERWISE, ONGOING WORKUP AND TREATMENT OF HIV / FEVER PER ID. NO ROLE FOR ENDOSCOPIC EVALUATIONS AT THIS TIME.  CALL FOR QUESTIONS. THANKS.  WILL SIGN OFF.  Wilhemina Bonito. Eda Keys., M.D. [facility] Division of Gastroenterology

## 2011-05-21 NOTE — Progress Notes (Signed)
INFECTIOUS DISEASE PROGRESS NOTE  ID: Richard Rojas is a 21 y.o. male with hx of STD, polysubstance abuse who originally presented with n/v/d at [facility]. Transferred to [facility] with FMTS. CT showed colitis and the patient also has complained of headache, though history with him has been very difficulty for the primary team and with myself. He only answers yes or no for the most part. He appartently had diarrhea about 1 weeks. No blood but is occult positive. Also now, HIV Elisa is positive, though not yet confirmed. + fever to 102. No other complaints.     Subjective: Does not verbalize but does appear to understand and is awake.  Abtx:  Anti-infectives     Start     Dose/Rate Route Frequency Ordered Stop   05/20/11 1600   fluconazole (DIFLUCAN) tablet 400 mg        400 mg Oral Daily 05/20/11 1441     05/20/11 1300   ciprofloxacin (CIPRO) IVPB 400 mg        400 mg 200 mL/hr over 60 Minutes Intravenous Every 12 hours 05/20/11 1143     05/19/11 2200   cefTRIAXone (ROCEPHIN) 2 g in dextrose 5 % 50 mL IVPB  Status:  Discontinued        2 g 100 mL/hr over 30 Minutes Intravenous Every 12 hours 05/19/11 1934 05/20/11 1143   05/19/11 2000   piperacillin-tazobactam (ZOSYN) IVPB 3.375 g  Status:  Discontinued        3.375 g 12.5 mL/hr over 240 Minutes Intravenous 3 times per day 05/19/11 1858 05/19/11 1934   05/19/11 2000   vancomycin (VANCOCIN) 750 mg in sodium chloride 0.9 % 150 mL IVPB  Status:  Discontinued        750 mg 150 mL/hr over 60 Minutes Intravenous Every 8 hours 05/19/11 1858 05/19/11 1900   05/19/11 2000   vancomycin (VANCOCIN) IVPB 1000 mg/200 mL premix  Status:  Discontinued        1,000 mg 200 mL/hr over 60 Minutes Intravenous Every 8 hours 05/19/11 1900 05/20/11 1143   05/19/11 2000   ganciclovir (CYTOVENE) 310 mg in sodium chloride 0.9 % 100 mL IVPB  Status:  Discontinued        5 mg/kg  61.7 kg (Order-Specific) 100 mL/hr over 60 Minutes Intravenous Every 12 hours 05/19/11  1921 05/20/11 1143   05/19/11 2000   fluconazole (DIFLUCAN) IVPB 400 mg  Status:  Discontinued        400 mg 200 mL/hr over 60 Minutes Intravenous Every 24 hours 05/19/11 1921 05/20/11 1143   05/19/11 1100   ciprofloxacin (CIPRO) IVPB 400 mg  Status:  Discontinued        400 mg 200 mL/hr over 60 Minutes Intravenous Every 12 hours 05/19/11 0950 05/19/11 1823   05/19/11 1100   metroNIDAZOLE (FLAGYL) IVPB 500 mg        500 mg 100 mL/hr over 60 Minutes Intravenous Every 8 hours 05/19/11 0950            Medications: I have reviewed the patient's current medications.  Objective: Vital signs in last 24 hours: Temp:  [98.4 F (36.9 C)-99.6 F (37.6 C)] 98.9 F (37.2 C) (03/06 1207) Pulse Rate:  [78-100] 81  (03/06 1207) Resp:  [19-29] 19  (03/06 1207) BP: (113-139)/(63-89) 126/81 mmHg (03/06 1207) SpO2:  [95 %-100 %] 99 % (03/06 1207) Weight:  [147 lb 14.9 oz (67.1 kg)] 147 lb 14.9 oz (67.1 kg) (03/06 0441)   General  appearance: alert, no distress and uncooperative Resp: clear to auscultation bilaterally GI: soft, non-tender; bowel sounds normal; no masses,  no organomegaly Extremities: extremities normal, atraumatic, no cyanosis or edema  Lab Results  [facility] 05/21/11 0415 05/20/11 0423  WBC 9.9 7.6  HGB 11.9* 12.4*  HCT 32.4* 33.8*  NA 132* 126*  K 2.8* 3.1*  CL 101 94*  CO2 21 22  BUN 5* 6  CREATININE 0.69 0.80  GLU -- --   Liver Panel  Basename 05/19/11 0600  PROT 5.9*  ALBUMIN 2.9*  AST 31  ALT 13  ALKPHOS 55  BILITOT 1.6*  BILIDIR --  IBILI --   Sedimentation Rate  Basename 05/19/11 0822  ESRSEDRATE 1   C-Reactive Protein  Basename 05/19/11 1050  CRP 36.21*    Microbiology: Recent Results (from the past 240 hour(s))  MRSA PCR SCREENING     Status: Normal   Collection Time   05/19/11  4:49 AM      Component Value Range Status Comment   MRSA by PCR NEGATIVE  NEGATIVE  Final   CLOSTRIDIUM DIFFICILE BY PCR     Status: Normal   Collection  Time   05/19/11  5:30 AM      Component Value Range Status Comment   C difficile by pcr NEGATIVE  NEGATIVE  Final   OVA AND PARASITE EXAMINATION     Status: Normal   Collection Time   05/19/11  8:08 AM      Component Value Range Status Comment   Specimen Description STOOL   Final    Special Requests NONE   Final    Ova and parasites NO OVA OR PARASITES SEEN MODERATE WBC   Final    Report Status 05/20/2011 FINAL   Final   EHEC TOXIN BY EIA, STOOL     Status: Normal   Collection Time   05/19/11 12:52 PM      Component Value Range Status Comment   Specimen Description STOOL   Final    Special Requests NONE   Final    EHEC Toxin by EIA Negative for Shiga toxins I and II   Final    Report Status 05/21/2011 FINAL   Final   GRAM STAIN     Status: Normal   Collection Time   05/19/11  8:00 PM      Component Value Range Status Comment   Specimen Description CSF   Final    Special Requests TUBE 2 @ 2CC   Final    Gram Stain     Final    Value: CYTOSPUN     WBC PRESENT,BOTH PMN AND MONONUCLEAR     NO ORGANISMS SEEN   Report Status 05/19/2011 FINAL   Final   CSF CULTURE     Status: Normal (Preliminary result)   Collection Time   05/19/11  8:00 PM      Component Value Range Status Comment   Specimen Description CSF   Final    Special Requests NO 2 2CC   Final    Gram Stain     Final    Value: CYTOSPIN WBC PRESENT,BOTH PMN AND MONONUCLEAR     NO ORGANISMS SEEN     Performed at [facility]   Culture NO GROWTH 1 DAY   Final    Report Status PENDING   Incomplete   AFB CULTURE, BLOOD     Status: Normal (Preliminary result)   Collection Time   05/20/11  2:00 PM  Component Value Range Status Comment   Specimen Description BLOOD RIGHT ARM   Final    Special Requests NONE AFB 3CC   Final    Culture     Final    Value: CULTURE WILL BE EXAMINED FOR 6 WEEKS BEFORE ISSUING A FINAL REPORT   Report Status PENDING   Incomplete     Studies/Results: Dg Chest 2 View  05/20/2011  *RADIOLOGY  REPORT*  Clinical Data: Evaluate for pneumonia  CHEST - 2 VIEW  Comparison: 10/12/2008  Findings:  Heart size appears normal.  There is no pleural effusion or pulmonary interstitial edema.  No airspace consolidation identified.  Scoliosis rods are identified within the thoracic and lumbar spine.  IMPRESSION:  1. No evidence for pneumonia.  Original Report Authenticated By: Rosealee Albee, M.D.   Dg Fluoro Guide Lumbar Puncture  05/19/2011  Clinical Data: Rule out cryptococcal meningitis;  HIV POSITIVE  LUMBAR PUNCTURE FLUORO GUIDE  Comparison: CT abdomen and pelvis from 05/19/2011 was reviewed. Head CT from 05/19/2011 was reviewed.  Findings:  I discussed the risks, benefits, and alternatives to lumbar puncture with the patient prior to the procedure. Emphasized risks included bleeding, infection, and headache. Epidural hematoma requiring surgical evacuation was discussed.  The patient voiced understanding and wished to proceed.  Written informed consent was obtained on the floor prior to the patient coming to radiology.  However, I personally reviewed the procedure with the patient before beginning. He was given an opportunity to ask questions and all questions were answered.  Using fluoroscopic guidance, an appropriate skin site was identified at the midline back.  This area of skin was prepped and draped in the usual sterile fashion.  Anesthesia of the skin and deeper soft tissues was obtained using 1% lidocaine without epinephrine.  Using a standard 20 gauge spinal needle, the CSF space was punctured at the L5-5 level on the the second pass. Clear CSF was obtained at this level. Opening pressure was 24 cm H2O.  A total of approximately 11 ml of clear CSF was obtained across four separate tubes.  IMPRESSION: Successful fluoro guided lumbar puncture.  CSF aliquots were sent to the laboratory for appropriate analysis.  The procedure was well tolerated without evidence for immediate complications.  Original  Report Authenticated By: ERIC A. MANSELL, M.D.     Assessment/Plan: 1) HIV - not yet confirmed but hopefully soon with the viral load.  CD 4 190 but I suspect this is acute HIV and it will rebound up a month or so later.  Regardless, he should start PCP prophylaxis when confirmed.    2) colitiis - hard to tell if it is improving with this uncooperative patient.  Continue with cipro and flagyl.  No further work up by GI at this time.  3) Esophagitis - continue fluconazole.    Douglas Rooks Infectious Diseases 05/21/2011, 2:25 PM

## 2011-05-21 NOTE — Progress Notes (Signed)
  Echocardiogram 2D Echocardiogram has been performed.  Richard Rojas 05/21/2011, 9:47 AM

## 2011-05-22 ENCOUNTER — Telehealth (HOSPITAL_COMMUNITY): Payer: Self-pay | Admitting: *Deleted

## 2011-05-22 LAB — HIV-1 RNA ULTRAQUANT REFLEX TO GENTYP+: HIV 1 RNA Quant: 6407 copies/mL — ABNORMAL HIGH (ref <20)

## 2011-05-22 LAB — BASIC METABOLIC PANEL
CO2: 24 mEq/L (ref 19–32)
Chloride: 103 mEq/L (ref 96–112)
GFR calc non Af Amer: >90 mL/min (ref >90)
Glucose, Bld: 117 mg/dL — ABNORMAL HIGH (ref 70–99)
Potassium: 3 mEq/L — ABNORMAL LOW (ref 3.5–5.1)
Sodium: 134 mEq/L — ABNORMAL LOW (ref 135–145)

## 2011-05-22 LAB — CMV (CYTOMEGALOVIRUS) DNA ULTRAQUANT, PCR: CMV DNA Quant: <200 copies/mL (ref <200)

## 2011-05-22 LAB — CBC
Hemoglobin: 12.1 g/dL — ABNORMAL LOW (ref 13.0–17.0)
RBC: 4.02 MIL/uL — ABNORMAL LOW (ref 4.22–5.81)

## 2011-05-22 MED ORDER — POTASSIUM CHLORIDE 10 MEQ/100ML IV SOLN
10.0000 meq | INTRAVENOUS | Status: AC
  Administered 2011-05-22 – 2011-05-23 (×3): 10 meq via INTRAVENOUS
  Filled 2011-05-22 (×3): qty 100

## 2011-05-22 MED ORDER — SULFAMETHOXAZOLE-TMP DS 800-160 MG PO TABS
1.0000 | ORAL_TABLET | Freq: Every day | ORAL | Status: DC
  Administered 2011-05-23: 1 via ORAL
  Filled 2011-05-22 (×2): qty 1

## 2011-05-22 MED ORDER — SULFAMETHOXAZOLE-TRIMETHOPRIM 400-80 MG PO TABS
1.0000 | ORAL_TABLET | Freq: Every day | ORAL | Status: DC
  Filled 2011-05-22: qty 1

## 2011-05-22 MED ORDER — POTASSIUM CHLORIDE 10 MEQ/100ML IV SOLN
10.0000 meq | INTRAVENOUS | Status: DC
  Filled 2011-05-22 (×2): qty 100

## 2011-05-22 MED ORDER — WHITE PETROLATUM GEL
Status: AC
  Administered 2011-05-22: 08:00:00
  Filled 2011-05-22: qty 5

## 2011-05-22 MED ORDER — POTASSIUM CHLORIDE CRYS ER 20 MEQ PO TBCR
40.0000 meq | EXTENDED_RELEASE_TABLET | ORAL | Status: DC
  Administered 2011-05-22: 40 meq via ORAL
  Filled 2011-05-22 (×2): qty 2

## 2011-05-22 NOTE — Progress Notes (Signed)
Patient ID: Richard Rojas, male   DOB: 1991/01/30, 21 y.o.   MRN: [id number] PGY-1 Daily Progress Note Family Medicine Teaching Service D. Piloto Rolene Arbour, MD Service Pager: [phone]  Patient name: Richard Rojas  Medical record [id number] Date of birth:18-Jan-1991 Age: 21 y.o. Gender: male  LOS: 4 days   Subjective: afebrile.1 small diarrhea reported. Pt more cooperative. Nausea mild no vomiting.  Objective: Vitals: Filed Vitals:   05/22/11 0611  BP: 118/71  Pulse: 79  Temp: 98.6 F (37 C)  Resp: 18    Physical Exam: Gen: Pt cachectic NAD in bed.  Alert. More cooperative today HEENT: neck supple. Moist mucosa. CV: RRR, no murmurs rubs or gallops  PULM: Clear to auscultation bilaterally. No wheezes/rales/rhonchi  ABD: Soft, excavated . No tender to palpation. EXT: No edema.  Neuro: Alert oriented x3. No neurologic motor or sensorial focalization.  Labs and imaging:   BMET  Lab 05/21/11 1417 05/21/11 0415 05/20/11 0423  NA 131* 132* 126*  K 3.0* 2.8* 3.1*  CL 100 101 94*  CO2 21 21 22   BUN 5* 5* 6  CREATININE 0.66 0.69 0.80  LABGLOM -- -- --  GLUCOSE 113* -- --  CALCIUM 8.1* 7.9* 8.2*     Medications: Scheduled Meds:   . ciprofloxacin  400 mg Intravenous Q12H  . fluconazole  400 mg Oral Daily  . metronidazole  500 mg Intravenous Q8H  . pantoprazole (PROTONIX) IV  40 mg Intravenous Q24H  . potassium chloride  10 mEq Intravenous Q1 Hr x 4  . potassium chloride  10 mEq Intravenous Q1 Hr x 6  . potassium chloride  10 mEq Intravenous Q1 Hr x 2  . potassium chloride      . sulfamethoxazole-trimethoprim  1 tablet Oral Daily  . white petrolatum       Continuous Infusions:   . dextrose 5 % and 0.45% NaCl 100 mL/hr at 05/22/11 0600  . DISCONTD: sodium chloride 1,000 mL (05/21/11 0930)   PRN Meds:.morphine injection, ondansetron (ZOFRAN) IV, promethazine  Assessment and Plan: Richard Rojas is a 21 y.o.admitted for  fever, nausea, vomiting, diarrhea and  altered mental status and has been diagnosed with HIV.  1. HIV positive: Confirmed with viral load. CD4 count 190 . Appreciate ID consult and will f/u recommendations. -Started on PCP Prophylaxis on Bactrim DS daily.  2. Colitis and Esophagitis: Pt afebrile for 24 h. Diarrhea improved. GI consulted no EGD at this time needed.   -Negative for Hep A, B or C. Neg for ova or parasite  -Continue with  Cipro, Metronidazole and Fluconazole for colitis and esophagitis.  3. Altered mental status: resolved.  4. Electrolytes disbalances: hyponatremia that has improved since admission and hypokalemia secondary to vomiting.  -Advance diet as tolerated. (nausea has improved) -Phenergan and Zofran PRN alt. -K repletion as needed. Magnesium level WNL.  5. Acute renal failure: resolved  6. Thombocytopenia: platelet count of improved.  FEN/GI: IVF D5 1/2 NS at 100 ml/h. Advance diet as tolerated. Prophylaxis: SCD's   Disposition: pending improvement.  D. [place] Rolene Arbour, MD PGY1, [facility] Medicine Teaching Service Pager [phone] 05/22/2011

## 2011-05-22 NOTE — Discharge Summary (Signed)
Physician Discharge Summary  Patient ID: Richard Rojas [id number] Jan 27, 1991 21 y.o.  Admit date: 05/18/2011 Discharge date: 05/22/2011  PCP: Edd Arbour, MD, MD   Discharge Diagnosis: 1. New diagnose HIV 2. Colitis 3. Esophagitis  Discharge Medications There are no discharge medications for this patient.   Consults:Infectious Disease. Gastroenterology  Labs:  Results for orders placed during the hospital encounter of 05/18/11 (from the past 72 hour(s))  OCCULT BLOOD X 1 CARD TO LAB, STOOL     Status: Normal   Collection Time   05/19/11  5:25 PM      Component Value Range Comment   Fecal Occult Bld POSITIVE     CRYPTOCOCCAL ANTIGEN, CSF     Status: Normal   Collection Time   05/19/11  8:00 PM      Component Value Range Comment   Crypto Ag NEGATIVE  NEGATIVE     Cryptococcal Ag Titer NOT INDICATED  NOT INDICATED    PROTEIN AND GLUCOSE, CSF     Status: Abnormal   Collection Time   05/19/11  8:00 PM      Component Value Range Comment   Glucose, CSF 57  43 - 76 (mg/dL)    Total  Protein, CSF 56 (*) 15 - 45 (mg/dL)   GRAM STAIN     Status: Normal   Collection Time   05/19/11  8:00 PM      Component Value Range Comment   Specimen Description CSF      Special Requests TUBE 2 @ 2CC      Gram Stain        Value: CYTOSPUN     WBC PRESENT,BOTH PMN AND MONONUCLEAR     NO ORGANISMS SEEN   Report Status 05/19/2011 FINAL     VDRL, CSF     Status: Normal   Collection Time   05/19/11  8:00 PM      Component Value Range Comment   VDRL Quant, CSF NON REACTIVE  Non Reac    CSF CELL COUNT WITH DIFFERENTIAL     Status: Abnormal   Collection Time   05/19/11  8:00 PM      Component Value Range Comment   Tube # 3      Color, CSF COLORLESS  COLORLESS     Appearance, CSF CLEAR  CLEAR     Supernatant NOT INDICATED      RBC Count, CSF 106 (*) 0 (/cu mm)    WBC, CSF 2  0 - 5 (/cu mm)    Lymphs, CSF RARE  40 - 80 (%)    Monocyte-Macrophage-Spinal Fluid RARE  15 - 45 (%)    Other Cells, CSF  TOO FEW TO COUNT, SMEAR AVAILABLE FOR REVIEW     CSF CULTURE     Status: Normal (Preliminary result)   Collection Time   05/19/11  8:00 PM      Component Value Range Comment   Specimen Description CSF      Special Requests NO 2 2CC      Gram Stain        Value: CYTOSPIN WBC PRESENT,BOTH PMN AND MONONUCLEAR     NO ORGANISMS SEEN     Performed at [facility]   Culture NO GROWTH 2 DAYS      Report Status PENDING     BASIC METABOLIC PANEL     Status: Abnormal   Collection Time   05/19/11 10:11 PM      Component Value Range Comment  Sodium 123 (*) 135 - 145 (mEq/L)    Potassium 3.6  3.5 - 5.1 (mEq/L)    Chloride 90 (*) 96 - 112 (mEq/L)    CO2 22  19 - 32 (mEq/L)    Glucose, Bld 85  70 - 99 (mg/dL)    BUN 6  6 - 23 (mg/dL)    Creatinine, Ser 1.61  0.50 - 1.35 (mg/dL)    Calcium 8.0 (*) 8.4 - 10.5 (mg/dL)    GFR calc non Af Amer >90  >90 (mL/min)    GFR calc Af Amer >90  >90 (mL/min)   BASIC METABOLIC PANEL     Status: Abnormal   Collection Time   05/20/11  4:23 AM      Component Value Range Comment   Sodium 126 (*) 135 - 145 (mEq/L)    Potassium 3.1 (*) 3.5 - 5.1 (mEq/L)    Chloride 94 (*) 96 - 112 (mEq/L)    CO2 22  19 - 32 (mEq/L)    Glucose, Bld 90  70 - 99 (mg/dL)    BUN 6  6 - 23 (mg/dL)    Creatinine, Ser 0.96  0.50 - 1.35 (mg/dL)    Calcium 8.2 (*) 8.4 - 10.5 (mg/dL)    GFR calc non Af Amer >90  >90 (mL/min)    GFR calc Af Amer >90  >90 (mL/min)   CBC     Status: Abnormal   Collection Time   05/20/11  4:23 AM      Component Value Range Comment   WBC 7.6  4.0 - 10.5 (K/uL)    RBC 4.07 (*) 4.22 - 5.81 (MIL/uL)    Hemoglobin 12.4 (*) 13.0 - 17.0 (g/dL)    HCT 04.5 (*) 40.9 - 52.0 (%)    MCV 83.0  78.0 - 100.0 (fL)    MCH 30.5  26.0 - 34.0 (pg)    MCHC 36.7 (*) 30.0 - 36.0 (g/dL)    RDW 81.1  91.4 - 78.2 (%)    Platelets 110 (*) 150 - 400 (K/uL) PLATELET COUNT CONFIRMED BY SMEAR  OCCULT BLOOD X 1 CARD TO LAB, STOOL     Status: Normal   Collection Time   05/20/11   7:49 AM      Component Value Range Comment   Fecal Occult Bld POSITIVE     HIV-1 RNA ULTRAQUANT REFLEX TO GENTYP+     Status: Abnormal   Collection Time   05/20/11  1:52 PM      Component Value Range Comment   HIV 1 RNA Quant 6407 (*) <20 (copies/mL)    HIV1 RNA Quant, Log 3.81 (*) <1.30 (log 10)   HEPATITIS B SURFACE ANTIGEN     Status: Normal   Collection Time   05/20/11  1:52 PM      Component Value Range Comment   Hepatitis B Surface Ag NEGATIVE  NEGATIVE    HEPATITIS B SURFACE ANTIBODY     Status: Abnormal   Collection Time   05/20/11  1:52 PM      Component Value Range Comment   Hep B S Ab POSITIVE (*) NEGATIVE    HEPATITIS B CORE ANTIBODY, TOTAL     Status: Normal   Collection Time   05/20/11  1:52 PM      Component Value Range Comment   Hep B Core Total Ab NEGATIVE  NEGATIVE    HEPATITIS C ANTIBODY     Status: Normal   Collection Time   05/20/11  1:52 PM      Component Value Range Comment   HCV Ab NEGATIVE  NEGATIVE    HEPATITIS A ANTIBODY, IGM     Status: Normal   Collection Time   05/20/11  1:52 PM      Component Value Range Comment   Hep A IgM NEGATIVE  NEGATIVE    AFB CULTURE, BLOOD     Status: Normal (Preliminary result)   Collection Time   05/20/11  2:00 PM      Component Value Range Comment   Specimen Description BLOOD RIGHT ARM      Special Requests NONE AFB 3CC      Culture        Value: CULTURE WILL BE EXAMINED FOR 6 WEEKS BEFORE ISSUING A FINAL REPORT   Report Status PENDING     CBC     Status: Abnormal   Collection Time   05/21/11  4:15 AM      Component Value Range Comment   WBC 9.9  4.0 - 10.5 (K/uL)    RBC 3.93 (*) 4.22 - 5.81 (MIL/uL)    Hemoglobin 11.9 (*) 13.0 - 17.0 (g/dL)    HCT 14.7 (*) 82.9 - 52.0 (%)    MCV 82.4  78.0 - 100.0 (fL)    MCH 30.3  26.0 - 34.0 (pg)    MCHC 36.7 (*) 30.0 - 36.0 (g/dL)    RDW 56.2  13.0 - 86.5 (%)    Platelets 146 (*) 150 - 400 (K/uL)   BASIC METABOLIC PANEL     Status: Abnormal   Collection Time   05/21/11  4:15 AM       Component Value Range Comment   Sodium 132 (*) 135 - 145 (mEq/L)    Potassium 2.8 (*) 3.5 - 5.1 (mEq/L)    Chloride 101  96 - 112 (mEq/L)    CO2 21  19 - 32 (mEq/L)    Glucose, Bld 108 (*) 70 - 99 (mg/dL)    BUN 5 (*) 6 - 23 (mg/dL)    Creatinine, Ser 7.84  0.50 - 1.35 (mg/dL)    Calcium 7.9 (*) 8.4 - 10.5 (mg/dL)    GFR calc non Af Amer >90  >90 (mL/min)    GFR calc Af Amer >90  >90 (mL/min)   CMV (CYTOMEGALOVIRUS) DNA ULTRAQUANT, PCR     Status: Normal   Collection Time   05/21/11  4:15 AM      Component Value Range Comment   Specimen Source-CMV Whole Blood      CMV DNA Quant <200  <200 (copies/mL)   BASIC METABOLIC PANEL     Status: Abnormal   Collection Time   05/21/11  2:17 PM      Component Value Range Comment   Sodium 131 (*) 135 - 145 (mEq/L)    Potassium 3.0 (*) 3.5 - 5.1 (mEq/L)    Chloride 100  96 - 112 (mEq/L)    CO2 21  19 - 32 (mEq/L)    Glucose, Bld 113 (*) 70 - 99 (mg/dL)    BUN 5 (*) 6 - 23 (mg/dL)    Creatinine, Ser 6.96  0.50 - 1.35 (mg/dL)    Calcium 8.1 (*) 8.4 - 10.5 (mg/dL)    GFR calc non Af Amer >90  >90 (mL/min)    GFR calc Af Amer >90  >90 (mL/min)   MAGNESIUM     Status: Normal   Collection Time   05/21/11  2:17 PM  Component Value Range Comment   Magnesium 2.0  1.5 - 2.5 (mg/dL)   CBC     Status: Abnormal   Collection Time   05/22/11 10:35 AM      Component Value Range Comment   WBC 8.2  4.0 - 10.5 (K/uL)    RBC 4.02 (*) 4.22 - 5.81 (MIL/uL)    Hemoglobin 12.1 (*) 13.0 - 17.0 (g/dL)    HCT 45.4 (*) 09.8 - 52.0 (%)    MCV 83.3  78.0 - 100.0 (fL)    MCH 30.1  26.0 - 34.0 (pg)    MCHC 36.1 (*) 30.0 - 36.0 (g/dL)    RDW 11.9  14.7 - 82.9 (%)    Platelets 195  150 - 400 (K/uL)   BASIC METABOLIC PANEL     Status: Abnormal   Collection Time   05/22/11 10:35 AM      Component Value Range Comment   Sodium 134 (*) 135 - 145 (mEq/L)    Potassium 3.0 (*) 3.5 - 5.1 (mEq/L)    Chloride 103  96 - 112 (mEq/L)    CO2 24  19 - 32 (mEq/L)    Glucose, Bld  117 (*) 70 - 99 (mg/dL)    BUN 4 (*) 6 - 23 (mg/dL)    Creatinine, Ser 5.62  0.50 - 1.35 (mg/dL)    Calcium 7.8 (*) 8.4 - 10.5 (mg/dL)    GFR calc non Af Amer >90  >90 (mL/min)    GFR calc Af Amer >90  >90 (mL/min)     Procedures/Imaging:  Dg Chest 2 View 05/20/2011 1. No evidence for pneumonia.  Original Report Authenticated By: Rosealee Albee, M.D.   Ct Head W Wo Contrast 05/19/2011 IMPRESSION: 1.  No acute intracranial abnormalities. 2.  Normal appearance of the brain.  Original Report Authenticated By: Florencia Reasons, M.D.   Ct Abdomen Pelvis W Contrast 05/19/2011 MPRESSION: Diffuse inflammation of the colon.  Question inflammatory versus infectious in origin.  Thickened appearance of the distal esophagus with mild infiltration of surrounding fat planes.  Question result of mild esophagitis.  Original Report Authenticated By: Fuller Canada, M.D.   Dg Fluoro Guide Lumbar Puncture 05/19/2011.  IMPRESSION: Successful fluoro guided lumbar puncture.  CSF aliquots were sent to the laboratory for appropriate analysis.  The procedure was well tolerated without evidence for immediate complications.  Original Report Authenticated By: ERIC A. MANSELL, M.D.     Brief Hospital Course: Pt admitted as [facility] after presenting with fever, nausea, vomiting, diarrhea and agitation. After full workup pt was diagnosed with HIV, mild esophagitis and colitis. Was on a full spectrum antibiotic and after diagnosis narrowed to Ciprofloxacin, Metronidazole and Fluconazole. CD4count was 190 so Bactrim DS daily was added for PCP prophylaxis. ID was consulted and Pt will have f/u after discharge. Pt wishes are not to inform family about his current condition and Dx. GI also consulted and due to pt improvement Endoscopy was not needed at this time. He presented with agitation, pinpoint pupils. UDS was positive for THC and Opioids(was treated with morphine for LLQ pain before urine was collected for this test)  also Pt had a persistent hyponatremia that was slowly corrected as well as hypokalemia.  Thrombocytopenia on admission also improved to lower normal values. Acute Renal failure secondary to dehydration on admission was also back to normal Creatinine and GFR with proper hydration. Pt afebrile for 72 h, no diarrhea or vomiting reported in more than 48 h. Pt tolerating  PO adequately.   Pt has protein-caloric malnutrition. Encourage pt to increase frequency of meals. Ensure will be good option to supplement with.   Patient condition at time of discharge/disposition:  Patient is discharge home on stable medical condition.   Follow up issues: 1. Syphilis titers. 2. HIV  Discharge follow up:  Dr. Michaelle Copas PCP on Friday 15th of March at 10:15am Dr. Luciana Axe ID will call him and schedule an appointment for f/u HIV    D. Piloto Rolene Arbour, MD  [facility] 05/22/2011

## 2011-05-23 DIAGNOSIS — A539 Syphilis, unspecified: Secondary | ICD-10-CM

## 2011-05-23 LAB — BASIC METABOLIC PANEL
Chloride: 104 mEq/L (ref 96–112)
Creatinine, Ser: 0.6 mg/dL (ref 0.50–1.35)
GFR calc Af Amer: >90 mL/min (ref >90)
Potassium: 3.2 mEq/L — ABNORMAL LOW (ref 3.5–5.1)
Sodium: 137 mEq/L (ref 135–145)

## 2011-05-23 LAB — CSF CULTURE W GRAM STAIN: Culture: NO GROWTH

## 2011-05-23 LAB — OCCULT BLOOD X 1 CARD TO LAB, STOOL: Fecal Occult Bld: NEGATIVE

## 2011-05-23 MED ORDER — ONDANSETRON HCL 4 MG PO TABS: 4.0000 mg | ORAL_TABLET | Freq: Three times a day (TID) | ORAL | Status: AC | PRN

## 2011-05-23 MED ORDER — METRONIDAZOLE 500 MG PO TABS: 500.0000 mg | ORAL_TABLET | Freq: Three times a day (TID) | ORAL | Status: AC

## 2011-05-23 MED ORDER — PANTOPRAZOLE SODIUM 40 MG PO TBEC: 40.0000 mg | DELAYED_RELEASE_TABLET | Freq: Every day | ORAL | Status: DC

## 2011-05-23 MED ORDER — METRONIDAZOLE 500 MG PO TABS
500.0000 mg | ORAL_TABLET | Freq: Three times a day (TID) | ORAL | Status: DC
  Administered 2011-05-23: 500 mg via ORAL
  Filled 2011-05-23 (×3): qty 1

## 2011-05-23 MED ORDER — FLUCONAZOLE 200 MG PO TABS: 400.0000 mg | ORAL_TABLET | Freq: Every day | ORAL | Status: AC

## 2011-05-23 MED ORDER — SULFAMETHOXAZOLE-TMP DS 800-160 MG PO TABS: 1.0000 | ORAL_TABLET | Freq: Every day | ORAL | Status: AC

## 2011-05-23 MED ORDER — CIPROFLOXACIN HCL 500 MG PO TABS: 500.0000 mg | ORAL_TABLET | Freq: Two times a day (BID) | ORAL | Status: AC

## 2011-05-23 MED ORDER — PANTOPRAZOLE SODIUM 40 MG PO TBEC
40.0000 mg | DELAYED_RELEASE_TABLET | Freq: Every day | ORAL | Status: DC
Start: 2011-05-23 — End: 2011-05-23
  Administered 2011-05-23: 40 mg via ORAL
  Filled 2011-05-23: qty 1

## 2011-05-23 MED ORDER — CIPROFLOXACIN HCL 500 MG PO TABS
500.0000 mg | ORAL_TABLET | Freq: Two times a day (BID) | ORAL | Status: DC
  Administered 2011-05-23: 500 mg via ORAL
  Filled 2011-05-23 (×3): qty 1

## 2011-05-23 NOTE — Progress Notes (Signed)
FMTS Attending Daily Note: Denny Levy MD [phone] pager office [phone] I  have seen and examined this patient, reviewed their chart. I have discussed this patient with the resident. I agree with the resident's findings, assessment and care plan. Significanly improved since admission. If he is able to eat, can consider discharge. He will have outpatient follow up with ID clinic for HIV care.

## 2011-05-23 NOTE — Progress Notes (Signed)
Patient ID: Richard Rojas, male   DOB: 11-06-1990, 21 y.o.   MRN: [id number] PGY-1 Daily Progress Note Family Medicine Teaching Service D. Piloto Rolene Arbour, MD Service Pager: [phone]  Patient name: Richard Rojas  Medical record [id number] Date of birth:1990-07-21 Age: 21 y.o. Gender: male  LOS: 5 days   Subjective: afebrile.1 small diarrhea reported. Pt more cooperative. Nausea mild no vomiting. Small improvement tolerating orals.  Objective: Vitals: Filed Vitals:   05/23/11 0600  BP: 114/72  Pulse: 70  Temp: 98.9 F (37.2 C)  Resp: 16    Physical Exam: Gen: Pt cachectic NAD in bed.  Alert. More cooperative today HEENT: neck supple. Moist mucosa. CV: RRR, no murmurs rubs or gallops  PULM: Clear to auscultation bilaterally. No wheezes/rales/rhonchi  ABD: Soft, excavated . No tender to palpation. EXT: No edema.  Neuro: Alert oriented x3. No neurologic motor or sensorial focalization.  Medications: Scheduled Meds:   . ciprofloxacin  500 mg Oral BID  . fluconazole  400 mg Oral Daily  . metroNIDAZOLE  500 mg Oral Q8H  . pantoprazole  40 mg Oral Q1200  . potassium chloride  10 mEq Intravenous Q1 Hr x 3  . sulfamethoxazole-trimethoprim  1 tablet Oral Daily   Continuous Infusions:   . dextrose 5 % and 0.45% NaCl 100 mL/hr at 05/23/11 0747   PRN Meds:.morphine injection, ondansetron (ZOFRAN) IV, promethazine   Assessment and Plan: Richard Rojas is a 20 y.o.admitted for  fever, nausea, vomiting, diarrhea and altered mental status and has been diagnosed with HIV.  1. HIV positive: Confirmed with viral load. CD4 count 190 . Appreciate ID consult and will f/u recommendations. - PCP Prophylaxis ctrim DS daily.  2. Colitis and Esophagitis: Pt afebrile for 48 h. Diarrhea improved. GI consulted no Endoscopic studies needed  at this time.  -Negative for Hep A, B or C. Neg for ova or parasite  -Continue with  Cipro, Metronidazole and Fluconazole for colitis and  esophagitis changed to oral  - PPI   3. Electrolytes disbalances: hyponatremia that has improved since admission and hypokalemia secondary to vomiting.  -Advance diet as tolerated. (nausea has improved) -Phenergan and Zofran PRN alt. -K repletion as needed. Magnesium level WNL.  FEN/GI: IVF D5 1/2 NS at 100 ml/h. Advance diet as tolerated. Prophylaxis: SCD's ,protonix Disposition: possible home today if can tolerate PO.  D. Piloto Rolene Arbour, MD PGY1, [facility] Medicine Teaching Service Pager [phone] 05/23/2011

## 2011-05-23 NOTE — Progress Notes (Signed)
INFECTIOUS DISEASE PROGRESS NOTE  ID: Richard Rojas is a 21 y.o. male with hx of STD, polysubstance abuse who originally presented with n/v/d at [facility]. Transferred to [facility] with FMTS. CT showed colitis and the patient also has complained of headache, though history with him has been very difficulty for the primary team and with myself. He only answers yes or no for the most part. He appartently had diarrhea about 1 weeks. No blood but is occult positive. HIV positive with confirmation by viral load.  Previous antibody negative 07/2010.      Subjective: Complains of abdominal pain.   Abtx:  Anti-infectives     Start     Dose/Rate Route Frequency Ordered Stop   05/23/11 1100   ciprofloxacin (CIPRO) tablet 500 mg        500 mg Oral 2 times daily 05/23/11 0957     05/23/11 1100   metroNIDAZOLE (FLAGYL) tablet 500 mg        500 mg Oral 3 times per day 05/23/11 0957     05/23/11 0000   ciprofloxacin (CIPRO) 500 MG tablet        500 mg Oral 2 times daily 05/23/11 1151 05/30/11 2359   05/23/11 0000   fluconazole (DIFLUCAN) 200 MG tablet        400 mg Oral Daily 05/23/11 1151 05/30/11 2359   05/23/11 0000   metroNIDAZOLE (FLAGYL) 500 MG tablet        500 mg Oral Every 8 hours 05/23/11 1151 05/30/11 2359   05/23/11 0000  sulfamethoxazole-trimethoprim (BACTRIM DS) 800-160 MG per tablet       1 tablet Oral Daily 05/23/11 1151 06/02/11 2359   05/22/11 1000   sulfamethoxazole-trimethoprim (BACTRIM,SEPTRA) 400-80 MG per tablet 1 tablet  Status:  Discontinued        1 tablet Oral Daily 05/22/11 0903 05/22/11 0909   05/22/11 1000  sulfamethoxazole-trimethoprim (BACTRIM DS) 800-160 MG per tablet 1 tablet       1 tablet Oral Daily 05/22/11 0910     05/20/11 1600   fluconazole (DIFLUCAN) tablet 400 mg        400 mg Oral Daily 05/20/11 1441     05/20/11 1300   ciprofloxacin (CIPRO) IVPB 400 mg  Status:  Discontinued        400 mg 200 mL/hr over 60 Minutes Intravenous Every 12 hours 05/20/11 1143  05/23/11 0956   05/19/11 2200   cefTRIAXone (ROCEPHIN) 2 g in dextrose 5 % 50 mL IVPB  Status:  Discontinued        2 g 100 mL/hr over 30 Minutes Intravenous Every 12 hours 05/19/11 1934 05/20/11 1143   05/19/11 2000   piperacillin-tazobactam (ZOSYN) IVPB 3.375 g  Status:  Discontinued        3.375 g 12.5 mL/hr over 240 Minutes Intravenous 3 times per day 05/19/11 1858 05/19/11 1934   05/19/11 2000   vancomycin (VANCOCIN) 750 mg in sodium chloride 0.9 % 150 mL IVPB  Status:  Discontinued        750 mg 150 mL/hr over 60 Minutes Intravenous Every 8 hours 05/19/11 1858 05/19/11 1900   05/19/11 2000   vancomycin (VANCOCIN) IVPB 1000 mg/200 mL premix  Status:  Discontinued        1,000 mg 200 mL/hr over 60 Minutes Intravenous Every 8 hours 05/19/11 1900 05/20/11 1143   05/19/11 2000   ganciclovir (CYTOVENE) 310 mg in sodium chloride 0.9 % 100 mL IVPB  Status:  Discontinued  5 mg/kg  61.7 kg (Order-Specific) 100 mL/hr over 60 Minutes Intravenous Every 12 hours 05/19/11 1921 05/20/11 1143   05/19/11 2000   fluconazole (DIFLUCAN) IVPB 400 mg  Status:  Discontinued        400 mg 200 mL/hr over 60 Minutes Intravenous Every 24 hours 05/19/11 1921 05/20/11 1143   05/19/11 1100   ciprofloxacin (CIPRO) IVPB 400 mg  Status:  Discontinued        400 mg 200 mL/hr over 60 Minutes Intravenous Every 12 hours 05/19/11 0950 05/19/11 1823   05/19/11 1100   metroNIDAZOLE (FLAGYL) IVPB 500 mg  Status:  Discontinued        500 mg 100 mL/hr over 60 Minutes Intravenous Every 8 hours 05/19/11 0950 05/23/11 0956          Medications: I have reviewed the patient's current medications.  Objective: Vital signs in last 24 hours: Temp:  [98.6 F (37 C)-99.2 F (37.3 C)] 98.9 F (37.2 C) (03/08 0600) Pulse Rate:  [69-70] 70  (03/08 0600) Resp:  [16-20] 16  (03/08 0600) BP: (99-122)/(55-76) 114/72 mmHg (03/08 0600) SpO2:  [99 %-100 %] 99 % (03/08 0600)   General appearance: alert, no distress  and uncooperative Resp: clear to auscultation bilaterally GI: soft, non-tender; bowel sounds normal; no masses,  no organomegaly Extremities: extremities normal, atraumatic, no cyanosis or edema  Lab Results  Basename 05/23/11 1037 05/22/11 1035 05/21/11 0415  WBC -- 8.2 9.9  HGB -- 12.1* 11.9*  HCT -- 33.5* 32.4*  NA 137 134* --  K 3.2* 3.0* --  CL 104 103 --  CO2 26 24 --  BUN 4* 4* --  CREATININE 0.60 0.63 --  GLU -- -- --   Liver Panel No results found for this basename: PROT:2,ALBUMIN:2,AST:2,ALT:2,ALKPHOS:2,BILITOT:2,BILIDIR:2,IBILI:2 in the last 72 hours Sedimentation Rate No results found for this basename: ESRSEDRATE in the last 72 hours C-Reactive Protein No results found for this basename: CRP:2 in the last 72 hours  Microbiology: Recent Results (from the past 240 hour(s))  MRSA PCR SCREENING     Status: Normal   Collection Time   05/19/11  4:49 AM      Component Value Range Status Comment   MRSA by PCR NEGATIVE  NEGATIVE  Final   CLOSTRIDIUM DIFFICILE BY PCR     Status: Normal   Collection Time   05/19/11  5:30 AM      Component Value Range Status Comment   C difficile by pcr NEGATIVE  NEGATIVE  Final   OVA AND PARASITE EXAMINATION     Status: Normal   Collection Time   05/19/11  8:08 AM      Component Value Range Status Comment   Specimen Description STOOL   Final    Special Requests NONE   Final    Ova and parasites NO OVA OR PARASITES SEEN MODERATE WBC   Final    Report Status 05/20/2011 FINAL   Final   EHEC TOXIN BY EIA, STOOL     Status: Normal   Collection Time   05/19/11 12:52 PM      Component Value Range Status Comment   Specimen Description STOOL   Final    Special Requests NONE   Final    EHEC Toxin by EIA Negative for Shiga toxins I and II   Final    Report Status 05/21/2011 FINAL   Final   GRAM STAIN     Status: Normal   Collection Time   05/19/11  8:00 PM      Component Value Range Status Comment   Specimen Description CSF   Final    Special  Requests TUBE 2 @ 2CC   Final    Gram Stain     Final    Value: CYTOSPUN     WBC PRESENT,BOTH PMN AND MONONUCLEAR     NO ORGANISMS SEEN   Report Status 05/19/2011 FINAL   Final   CSF CULTURE     Status: Normal (Preliminary result)   Collection Time   05/19/11  8:00 PM      Component Value Range Status Comment   Specimen Description CSF   Final    Special Requests NO 2 2CC   Final    Gram Stain     Final    Value: CYTOSPIN WBC PRESENT,BOTH PMN AND MONONUCLEAR     NO ORGANISMS SEEN     Performed at [facility]   Culture NO GROWTH 2 DAYS   Final    Report Status PENDING   Incomplete   AFB CULTURE, BLOOD     Status: Normal (Preliminary result)   Collection Time   05/20/11  2:00 PM      Component Value Range Status Comment   Specimen Description BLOOD RIGHT ARM   Final    Special Requests NONE AFB 3CC   Final    Culture     Final    Value: CULTURE WILL BE EXAMINED FOR 6 WEEKS BEFORE ISSUING A FINAL REPORT   Report Status PENDING   Incomplete     Studies/Results: No results found.   Assessment/Plan: 1) HIV - confirmed with a viral load.  Patient now aware, family not aware.  He should continue with Bactrim DS daily for prophylaxis.  2) colitiis - continue with cipro and flagyl, po ok.  3) Esophagitis - continue fluconazole 21 day course.  4) Disposition - we will call patient after discharge with an appointment in our clinic.  He can be reached at [phone].  Ok to leave a message.  He should be seen in about 2 weeks.   Anand Tejada Infectious Diseases 05/23/2011, 12:03 PM

## 2011-05-23 NOTE — Discharge Instructions (Signed)
HIV Infection and AIDS HIV stands for human immunodeficiency virus. HIV is the virus that causes the disease known as AIDS (acquired immunodeficiency syndrome). HIV is a viral infection that attacks the T-cell lymphocytes of the human immune system. If left untreated, HIV will kill enough T-cells so that the body cannot fight off infection. Patients who have AIDS, suffer from "opportunistic infections." Opportunistic infections take advantage of the patient's weak immune system, and cause illness. RISK FACTORS   Direct contact with blood or other body fluids.   Unprotected sexual intercourse.   Sharing of contaminated needles.   Blood transfusions.   Infants whose mothers were infected, during pregnancy or through breast milk.  SYMPTOMS   Sometimes, no symptoms.   Flu-like symptoms.   Repeated severe yeast infections in mouth or vagina, despite treatment.   Swollen lymph nodes.   Muscle pain.   Joint pain.   Persistent diarrhea.   Loss of appetite.   Weight loss.   Frequent opportunistic diseases:   Kaposi's sarcoma.   Pneumocystis carinii pneumonia (PCP)   Tuberculosis.   Meningitis.   Herpes simplex infections.   Blurry vision.   Loss of vision.  PREVENTION   Know the sexual history of any new sexual partner.   Use safe sex practices, with barrier protection.   Avoid having multiple sexual partners.   Avoid direct contact with blood or other body fluids, by using gloves, goggles, and masks when you might encounter them.   Do not share needles.  TREATMENT  HIV and AIDS have no known cure. However, with early diagnosis and proper treatment, one can live a relatively healthy and long life. Treatment is directed at decreasing the level of virus in the body (viral load). To decrease the viral load, patients are given antiviral medicines. Patients are also given preventive care for many opportunistic diseases, such as pneumonia, tuberculosis, toxoplasmosis,  tetanus, hepatitis B, pneumococcal infections, and influenza. Opportunistic infections are also treated as they develop.   Exit[employer] Patient Information 2012 [place], [place].

## 2011-05-23 NOTE — Discharge Summary (Signed)
Family Medicine Teaching Service  Discharge Note : Attending Makaio Mach MD Pager [phone] Office [phone] I have seen and examined this patient, reviewed their chart and discussed discharge planning wit the resident at the time of discharge. I agree with the discharge plan as above.  

## 2011-05-24 LAB — HIV 1/2 CONFIRMATION
HIV-1 antibody: POSITIVE
HIV-2 Ab: NEGATIVE

## 2011-05-26 ENCOUNTER — Telehealth: Payer: Self-pay | Admitting: *Deleted

## 2011-05-26 NOTE — Telephone Encounter (Signed)
States he is HIV positive & was told to be seen here her # is [phone]. When I asked for his # & to talk with him, she said he did not have a phone & was weak & resting. I told her we would call & make an apt tomorrow

## 2011-05-27 LAB — CYTOMEGALOVIRUS PCR, QUALITATIVE

## 2011-05-27 NOTE — Telephone Encounter (Signed)
Tammy: Please see message below. His mom wants to schedule him an 042 appt.  Do you have a referral for him from a physician? Thanks Asher Muir

## 2011-05-28 ENCOUNTER — Telehealth (HOSPITAL_COMMUNITY): Payer: Self-pay | Admitting: *Deleted

## 2011-05-30 ENCOUNTER — Ambulatory Visit (INDEPENDENT_AMBULATORY_CARE_PROVIDER_SITE_OTHER): Payer: Medicaid Other | Admitting: Family Medicine

## 2011-05-30 ENCOUNTER — Encounter: Payer: Self-pay | Admitting: Family Medicine

## 2011-05-30 VITALS — BP 118/84 | HR 87 | Temp 98.1°F | Ht 73.0 in | Wt 133.0 lb

## 2011-05-30 DIAGNOSIS — B20 Human immunodeficiency virus [HIV] disease: Secondary | ICD-10-CM

## 2011-05-30 DIAGNOSIS — B9735 Human immunodeficiency virus, type 2 [HIV 2] as the cause of diseases classified elsewhere: Secondary | ICD-10-CM

## 2011-05-30 NOTE — Assessment & Plan Note (Signed)
Advised patient about the importance of following up with ID for HAART He understood this.  Contact the HD.   Lab Results  Component Value Date   [id number] 05/19/2011

## 2011-05-30 NOTE — Progress Notes (Signed)
  Subjective:    Patient ID: Richard Rojas, male    DOB: 1990/08/31, 21 y.o.   MRN: [id number]  HPI 1. HIV/AIDS hospital follow up.  Patient diagnosed in the hospital.  He has had 6 partners or more unprotected in the last 6 months. Last may he was diagnosed with Syphilis and this cleared up with antibiotics. At that time he was HIV negative. He has not seen ID yet.  He has no infections at this time. He has not contacted the Health Department.   Review of Systems Pertinent items are noted in HPI. No fever, chills, night sweats. No rash. No diarrhea. No emesis.    Objective:   Physical Exam Filed Vitals:   05/30/11 1036  BP: 118/84  Pulse: 87  Temp: 98.1 F (36.7 C)  TempSrc: Oral  Height: 6\' 1"  (1.854 m)  Weight: 133 lb (60.328 kg)  Skin:  Intact without suspicious lesions or rashes General: AAM, very cachetic, nad, aox3, pleasant.      Assessment & Plan:

## 2011-05-30 NOTE — Patient Instructions (Signed)
It was great to see you today!  Schedule an appointment to see me as needed.  You need to see the Infectious disease doctors soon.  Your nutrition is weak, you need to improve your diet.

## 2011-06-02 ENCOUNTER — Other Ambulatory Visit: Payer: Self-pay | Admitting: Family Medicine

## 2011-06-02 MED ORDER — AZITHROMYCIN 1 G PO PACK: 1.0000 | PACK | ORAL | Status: DC

## 2011-06-03 ENCOUNTER — Ambulatory Visit (INDEPENDENT_AMBULATORY_CARE_PROVIDER_SITE_OTHER): Payer: Medicaid Other

## 2011-06-03 ENCOUNTER — Telehealth: Payer: Self-pay | Admitting: *Deleted

## 2011-06-03 DIAGNOSIS — B20 Human immunodeficiency virus [HIV] disease: Secondary | ICD-10-CM

## 2011-06-03 MED ORDER — AZITHROMYCIN 500 MG PO TABS: 1000.0000 mg | ORAL_TABLET | ORAL | Status: DC

## 2011-06-03 MED ORDER — AZITHROMYCIN 600 MG PO TABS: 1200.0000 mg | ORAL_TABLET | Freq: Every day | ORAL | Status: DC

## 2011-06-03 NOTE — Telephone Encounter (Signed)
Addended by: Damita Lack on: 06/03/2011 11:51 AM   Modules accepted: Orders

## 2011-06-03 NOTE — Telephone Encounter (Signed)
Pharmacy calling to see if they can change the Zithromax 1gram powder to pills due to patient has no insurance and the pills are cheaper.  Paged to Dr. Rivka Safer. Per Dr. Cranston Neighbor Zithromax 1200 mg, take by mouth once weekly with eleven refills.  Richard Rojas

## 2011-06-03 NOTE — Telephone Encounter (Signed)
Richard Rojas from [facility] calling again to let us know the 600 mg tablets doubles Richard Rojas's co-pay.  States the cheapest would be to do 500mg , take 2 tablets weekly. Will send in to pharmacy per Dr. Rivka Safer.

## 2011-06-09 ENCOUNTER — Encounter: Payer: Self-pay | Admitting: Infectious Disease

## 2011-06-09 ENCOUNTER — Ambulatory Visit: Payer: Self-pay

## 2011-06-09 ENCOUNTER — Ambulatory Visit (INDEPENDENT_AMBULATORY_CARE_PROVIDER_SITE_OTHER): Payer: Medicaid Other | Admitting: Infectious Disease

## 2011-06-09 ENCOUNTER — Other Ambulatory Visit: Payer: Self-pay | Admitting: Licensed Clinical Social Worker

## 2011-06-09 VITALS — BP 119/78 | HR 102 | Temp 98.7°F | Wt 135.0 lb

## 2011-06-09 DIAGNOSIS — A539 Syphilis, unspecified: Secondary | ICD-10-CM

## 2011-06-09 DIAGNOSIS — B20 Human immunodeficiency virus [HIV] disease: Secondary | ICD-10-CM

## 2011-06-09 DIAGNOSIS — Z7251 High risk heterosexual behavior: Secondary | ICD-10-CM

## 2011-06-09 DIAGNOSIS — K219 Gastro-esophageal reflux disease without esophagitis: Secondary | ICD-10-CM

## 2011-06-09 DIAGNOSIS — R197 Diarrhea, unspecified: Secondary | ICD-10-CM | POA: Insufficient documentation

## 2011-06-09 MED ORDER — EFAVIRENZ-EMTRICITAB-TENOFOVIR 600-200-300 MG PO TABS: 1.0000 | ORAL_TABLET | Freq: Every day | ORAL | Status: DC

## 2011-06-09 MED ORDER — SULFAMETHOXAZOLE-TMP DS 800-160 MG PO TABS: 1.0000 | ORAL_TABLET | Freq: Every day | ORAL | Status: DC

## 2011-06-09 NOTE — Assessment & Plan Note (Signed)
Continue PPI ?

## 2011-06-09 NOTE — Assessment & Plan Note (Signed)
Says he was treated in [facility]. WIll get records and follow titers

## 2011-06-09 NOTE — Progress Notes (Signed)
Pt was diagnosed at [facility] on 05-18-13.  He is here today with his Mother, who is very supportive.  Pt states he was seen by [facility] physician on 05-30-11  and started on prophylactic therapy.  Pt gives a history of recent 12 lb weight loss.  He admits to depression and a referral was made to [facility] [place].   Pt states he is not really shocked about new diagnosis due to his sexual lifestyle. His current fear is having AIDS and treatment options.   Laurell Josephs, RN

## 2011-06-09 NOTE — Assessment & Plan Note (Signed)
Condoms provided

## 2011-06-09 NOTE — Progress Notes (Signed)
  Subjective:    Patient ID: Richard Rojas, male    DOB: 09-12-1990, 21 y.o.   MRN: [id number]  HPI  21 year old [place] American male with HIV/AIDS newsly diagnosed Naive to ARV recently admitted to [facility] service for nausea vomiting, and diarrhea. WOrkup was unrevealing. His nausea has resolved and now having loose bm 4x per day. He is taking septra ds for PCP prophylaxis but not on ARV. We discussed first line DHHS and 2nd line optons. AFter much discussion decided to go with atripla. I spent greater than 45 minutes with the patient including greater than 50% of time in face to face counsel of the patient and in coordination of their care.   Review of Systems  Constitutional: Negative for fever, chills, diaphoresis, activity change, appetite change, fatigue and unexpected weight change.  HENT: Negative for congestion, sore throat, rhinorrhea, sneezing, trouble swallowing and sinus pressure.   Eyes: Negative for photophobia and visual disturbance.  Respiratory: Negative for cough, chest tightness, shortness of breath, wheezing and stridor.   Cardiovascular: Negative for chest pain, palpitations and leg swelling.  Gastrointestinal: Positive for diarrhea. Negative for nausea, vomiting, abdominal pain, constipation, blood in stool, abdominal distention and anal bleeding.  Genitourinary: Negative for dysuria, hematuria, flank pain and difficulty urinating.  Musculoskeletal: Negative for myalgias, back pain, joint swelling, arthralgias and gait problem.  Skin: Negative for color change, pallor, rash and wound.  Neurological: Negative for dizziness, tremors, weakness and light-headedness.  Hematological: Negative for adenopathy. Does not bruise/bleed easily.  Psychiatric/Behavioral: Negative for behavioral problems, confusion, sleep disturbance, dysphoric mood, decreased concentration and agitation.       Objective:   Physical Exam  Constitutional: He is oriented to person, place, and time. He  appears well-developed and well-nourished. No distress.  HENT:  Head: Normocephalic and atraumatic.  Mouth/Throat: Oropharynx is clear and moist. No oropharyngeal exudate.  Eyes: Conjunctivae and EOM are normal. Pupils are equal, round, and reactive to light. No scleral icterus.  Neck: Normal range of motion. Neck supple. No JVD present.  Cardiovascular: Normal rate, regular rhythm and normal heart sounds.  Exam reveals no gallop and no friction rub.   No murmur heard. Pulmonary/Chest: Effort normal and breath sounds normal. No respiratory distress. He has no wheezes. He has no rales. He exhibits no tenderness.  Abdominal: He exhibits no distension and no mass. There is no tenderness. There is no rebound and no guarding.  Musculoskeletal: He exhibits no edema and no tenderness.  Lymphadenopathy:    He has no cervical adenopathy.  Neurological: He is alert and oriented to person, place, and time. He has normal reflexes. He exhibits normal muscle tone. Coordination normal.  Skin: Skin is warm and dry. He is not diaphoretic. No erythema. No pallor.  Psychiatric: He has a normal mood and affect. His behavior is normal. Judgment and thought content normal.          Assessment & Plan:  AIDS Start atripla. Continue septra. Has had pneumovax. Needs flu shot butnot available in our clinic today. Recheck labs in one month  Syphilis Says he was treated in [facility]. WIll get records and follow titers  GERD (gastroesophageal reflux disease) Continue PPI  Unprotected sexual intercourse Condoms provided

## 2011-06-09 NOTE — Assessment & Plan Note (Signed)
Start atripla. Continue septra. Has had pneumovax. Needs flu shot butnot available in our clinic today. Recheck labs in one month

## 2011-06-11 ENCOUNTER — Telehealth: Payer: Self-pay | Admitting: *Deleted

## 2011-06-11 DIAGNOSIS — R110 Nausea: Secondary | ICD-10-CM | POA: Insufficient documentation

## 2011-06-11 DIAGNOSIS — B20 Human immunodeficiency virus [HIV] disease: Secondary | ICD-10-CM

## 2011-06-11 MED ORDER — SULFAMETHOXAZOLE-TMP DS 800-160 MG PO TABS: 1.0000 | ORAL_TABLET | Freq: Every day | ORAL | Status: AC

## 2011-06-11 MED ORDER — ONDANSETRON HCL 4 MG PO TABS: 4.0000 mg | ORAL_TABLET | Freq: Four times a day (QID) | ORAL | Status: DC | PRN

## 2011-06-11 MED ORDER — ONDANSETRON HCL 4 MG PO TABS: 4.0000 mg | ORAL_TABLET | Freq: Four times a day (QID) | ORAL | Status: AC | PRN

## 2011-06-11 NOTE — Telephone Encounter (Signed)
zofran written for nausea

## 2011-06-11 NOTE — Telephone Encounter (Signed)
Requesting rx for nausea.  Pt uses [facility] on [address].  Please advise.  Pt uses [phone] for return phone calls.

## 2011-06-11 NOTE — Telephone Encounter (Signed)
Pt informed that medication is ready for pick up at pharmacy.

## 2011-06-11 NOTE — Telephone Encounter (Signed)
zofran is fine will write for it

## 2011-07-06 LAB — AFB CULTURE, BLOOD

## 2011-07-16 ENCOUNTER — Other Ambulatory Visit: Payer: Medicaid Other

## 2011-07-16 DIAGNOSIS — B20 Human immunodeficiency virus [HIV] disease: Secondary | ICD-10-CM

## 2011-07-16 LAB — COMPLETE METABOLIC PANEL WITH GFR
AST: 19 U/L (ref 0–37)
Albumin: 4.6 g/dL (ref 3.5–5.2)
BUN: 12 mg/dL (ref 6–23)
CO2: 29 mEq/L (ref 19–32)
Calcium: 9.5 mg/dL (ref 8.4–10.5)
Chloride: 105 mEq/L (ref 96–112)
GFR, Est Non African American: >89 mL/min
Glucose, Bld: 101 mg/dL — ABNORMAL HIGH (ref 70–99)
Potassium: 5 mEq/L (ref 3.5–5.3)

## 2011-07-16 LAB — CBC WITH DIFFERENTIAL/PLATELET
Basophils Relative: 1 % (ref 0–1)
Eosinophils Absolute: 0.1 10*3/uL (ref 0.0–0.7)
Lymphs Abs: 1.9 10*3/uL (ref 0.7–4.0)
MCV: 93.1 fL (ref 78.0–100.0)
Monocytes Absolute: 0.3 10*3/uL (ref 0.1–1.0)
Monocytes Relative: 8 % (ref 3–12)
Neutro Abs: 1.3 10*3/uL — ABNORMAL LOW (ref 1.7–7.7)
Neutrophils Relative %: 36 % — ABNORMAL LOW (ref 43–77)
RDW: 14.6 % (ref 11.5–15.5)
WBC: 3.6 10*3/uL — ABNORMAL LOW (ref 4.0–10.5)

## 2011-07-17 LAB — T-HELPER CELL (CD4) - (RCID CLINIC ONLY)
CD4 % Helper T Cell: 42 % (ref 33–55)
CD4 T Cell Abs: 770 uL (ref 400–2700)

## 2011-07-30 ENCOUNTER — Encounter (HOSPITAL_COMMUNITY): Payer: Self-pay | Admitting: *Deleted

## 2011-07-30 ENCOUNTER — Emergency Department (HOSPITAL_COMMUNITY)
Admission: EM | Admit: 2011-07-30 | Discharge: 2011-07-31 | Disposition: A | Discharge status: Home / Self Care | Payer: Medicaid Other | Attending: Emergency Medicine | Admitting: Emergency Medicine

## 2011-07-30 ENCOUNTER — Encounter: Payer: Self-pay | Admitting: Infectious Disease

## 2011-07-30 ENCOUNTER — Ambulatory Visit (INDEPENDENT_AMBULATORY_CARE_PROVIDER_SITE_OTHER): Payer: Medicaid Other | Admitting: Infectious Disease

## 2011-07-30 ENCOUNTER — Ambulatory Visit (HOSPITAL_COMMUNITY): Admission: AD | Admit: 2011-07-30 | Payer: Medicaid Other | Source: Ambulatory Visit | Admitting: Psychiatry

## 2011-07-30 VITALS — BP 138/88 | HR 102 | Temp 98.1°F | Ht 74.0 in | Wt 133.5 lb

## 2011-07-30 DIAGNOSIS — F3289 Other specified depressive episodes: Secondary | ICD-10-CM | POA: Insufficient documentation

## 2011-07-30 DIAGNOSIS — Z21 Asymptomatic human immunodeficiency virus [HIV] infection status: Secondary | ICD-10-CM

## 2011-07-30 DIAGNOSIS — B20 Human immunodeficiency virus [HIV] disease: Secondary | ICD-10-CM

## 2011-07-30 DIAGNOSIS — Z79899 Other long term (current) drug therapy: Secondary | ICD-10-CM | POA: Insufficient documentation

## 2011-07-30 DIAGNOSIS — F329 Major depressive disorder, single episode, unspecified: Secondary | ICD-10-CM | POA: Insufficient documentation

## 2011-07-30 DIAGNOSIS — R45851 Suicidal ideations: Secondary | ICD-10-CM

## 2011-07-30 DIAGNOSIS — F322 Major depressive disorder, single episode, severe without psychotic features: Secondary | ICD-10-CM

## 2011-07-30 DIAGNOSIS — A539 Syphilis, unspecified: Secondary | ICD-10-CM

## 2011-07-30 DIAGNOSIS — R110 Nausea: Secondary | ICD-10-CM | POA: Insufficient documentation

## 2011-07-30 DIAGNOSIS — R61 Generalized hyperhidrosis: Secondary | ICD-10-CM

## 2011-07-30 HISTORY — DX: Asymptomatic human immunodeficiency virus (hiv) infection status: Z21

## 2011-07-30 HISTORY — DX: Human immunodeficiency virus (HIV) disease: B20

## 2011-07-30 LAB — COMPREHENSIVE METABOLIC PANEL
Albumin: 4.6 g/dL (ref 3.5–5.2)
Alkaline Phosphatase: 111 U/L (ref 39–117)
BUN: 12 mg/dL (ref 6–23)
CO2: 29 mEq/L (ref 19–32)
Chloride: 102 mEq/L (ref 96–112)
Creatinine, Ser: 0.64 mg/dL (ref 0.50–1.35)
GFR calc Af Amer: >90 mL/min (ref >90)
GFR calc non Af Amer: >90 mL/min (ref >90)
Glucose, Bld: 91 mg/dL (ref 70–99)
Potassium: 4.3 mEq/L (ref 3.5–5.1)
Total Bilirubin: 0.2 mg/dL — ABNORMAL LOW (ref 0.3–1.2)

## 2011-07-30 LAB — RAPID URINE DRUG SCREEN, HOSP PERFORMED
Barbiturates: NOT DETECTED
Benzodiazepines: NOT DETECTED

## 2011-07-30 LAB — CBC
HCT: 43.4 % (ref 39.0–52.0)
Hemoglobin: 15.2 g/dL (ref 13.0–17.0)
RBC: 4.78 MIL/uL (ref 4.22–5.81)

## 2011-07-30 LAB — DIFFERENTIAL
Lymphocytes Relative: 51 % — ABNORMAL HIGH (ref 12–46)
Lymphs Abs: 2.2 10*3/uL (ref 0.7–4.0)
Monocytes Absolute: 0.4 10*3/uL (ref 0.1–1.0)
Monocytes Relative: 9 % (ref 3–12)
Neutro Abs: 1.5 10*3/uL — ABNORMAL LOW (ref 1.7–7.7)
Neutrophils Relative %: 36 % — ABNORMAL LOW (ref 43–77)

## 2011-07-30 LAB — URINALYSIS, ROUTINE W REFLEX MICROSCOPIC
Bilirubin Urine: NEGATIVE
Ketones, ur: NEGATIVE mg/dL
Nitrite: NEGATIVE
Protein, ur: NEGATIVE mg/dL
pH: 6.5 (ref 5.0–8.0)

## 2011-07-30 LAB — ETHANOL: Alcohol, Ethyl (B): <11 mg/dL (ref 0–11)

## 2011-07-30 MED ORDER — ALUM & MAG HYDROXIDE-SIMETH 200-200-20 MG/5ML PO SUSP
30.0000 mL | ORAL | Status: DC | PRN
Start: 2011-07-30 — End: 2011-07-31
  Administered 2011-07-30: 30 mL via ORAL
  Filled 2011-07-30: qty 30

## 2011-07-30 MED ORDER — IBUPROFEN 200 MG PO TABS: 600.0000 mg | ORAL_TABLET | Freq: Three times a day (TID) | ORAL | Status: DC | PRN

## 2011-07-30 MED ORDER — ONDANSETRON HCL 8 MG PO TABS
4.0000 mg | ORAL_TABLET | Freq: Three times a day (TID) | ORAL | Status: DC | PRN
  Administered 2011-07-31: 4 mg via ORAL
  Filled 2011-07-30: qty 1

## 2011-07-30 MED ORDER — NICOTINE 21 MG/24HR TD PT24: 21.0000 mg | MEDICATED_PATCH | TRANSDERMAL | Status: DC | PRN

## 2011-07-30 MED ORDER — ACETAMINOPHEN 325 MG PO TABS: 650.0000 mg | ORAL_TABLET | ORAL | Status: DC | PRN

## 2011-07-30 NOTE — ED Notes (Signed)
The pt has been undressed placed in scrubbs chg rn notified a-c not answering. Pt wanded

## 2011-07-30 NOTE — Assessment & Plan Note (Signed)
I have not seen records from [facility]. Will need to repeat titers. He may need retreatment if they do not appropriately drop

## 2011-07-30 NOTE — ED Notes (Signed)
The pt says he has been suicidal since march when he found out he had aids

## 2011-07-30 NOTE — Progress Notes (Signed)
Subjective:    Patient ID: Richard Rojas, male    DOB: [birth year], 21 y.o.   MRN: [id number]  HPI  21 year old [place] American man with HIV and AIDS who has been highly compliant with his anti-retroviral therapy, namely Atripla.. he has successfully suppressed his viruses from a few thousand copies now to 25 copies. He has had vigorous immune reconstitution with a CD4 count having risen from 192 up to 770.  Unfortunately however he is suffering from severe depressive symptoms related to his knowledge of having HIV infection. I should note that he has admitted to having a prior suicide attempt with a prescription narcotic from another friend this attempt was made several years ago and was unsuccessful. He lives at home with his mother who is aware of his diagnosis and somewhat excepting although maybe not completely. He is concerned that when he goes in public that people don't tell if he that he has HIV and therefore he has a phobia of being in public and has anxiety when he is in public. He is concerned that he appears underweight and that his low weight will give away the fact that he has HIV infection. He has had active suicidal ideation one week ago when he considered slitting his wrists. He has had passive ideation on a daily basis wishing that something bad would happen to him and that his antiretroviral pills or other medications might instead Quant contain a poison that would kill him and he would wake up and would not wake up. He is suffering from nausea without vomiting he also said the suffering from profuse sweats which I think is more likely related to immune reconstitution syndrome and or possibly his psychological state. He is contracted for safety I have strongly recommended that he be admitted to behavioral health for further therapy of his severe depression with suicidal ideation. I spent greater than 45 minutes with the patient including greater than 50% of time in face to face counsel  of the patient and in coordination of their care.   Review of Systems  Constitutional: Positive for diaphoresis and fatigue. Negative for fever, chills, activity change, appetite change and unexpected weight change.  HENT: Negative for congestion, sore throat, rhinorrhea, sneezing, trouble swallowing and sinus pressure.   Eyes: Negative for photophobia and visual disturbance.  Respiratory: Negative for cough, chest tightness, shortness of breath, wheezing and stridor.   Cardiovascular: Negative for chest pain, palpitations and leg swelling.  Gastrointestinal: Negative for nausea, vomiting, abdominal pain, diarrhea, constipation, blood in stool, abdominal distention and anal bleeding.  Genitourinary: Negative for dysuria, hematuria, flank pain and difficulty urinating.  Musculoskeletal: Negative for myalgias, back pain, joint swelling, arthralgias and gait problem.  Skin: Negative for color change, pallor, rash and wound.  Neurological: Negative for dizziness, tremors, weakness and light-headedness.  Hematological: Negative for adenopathy. Does not bruise/bleed easily.  Psychiatric/Behavioral: Positive for suicidal ideas, behavioral problems, dysphoric mood and agitation. Negative for confusion, sleep disturbance and decreased concentration. The patient is nervous/anxious.        Objective:   Physical Exam  Constitutional: He is oriented to person, place, and time. He appears well-nourished. No distress.  HENT:  Head: Normocephalic and atraumatic.  Mouth/Throat: Oropharynx is clear and moist. No oropharyngeal exudate.  Eyes: Conjunctivae and EOM are normal. Pupils are equal, round, and reactive to light. No scleral icterus.  Neck: Normal range of motion. Neck supple. No JVD present.  Cardiovascular: Normal rate, regular rhythm and normal heart sounds.  Exam  reveals no gallop and no friction rub.   No murmur heard. Pulmonary/Chest: Effort normal and breath sounds normal. No respiratory  distress. He has no wheezes. He has no rales. He exhibits no tenderness.  Abdominal: He exhibits no distension and no mass. There is no tenderness. There is no rebound and no guarding.  Musculoskeletal: He exhibits no edema and no tenderness.  Lymphadenopathy:    He has no cervical adenopathy.  Neurological: He is alert and oriented to person, place, and time. He has normal reflexes. He exhibits normal muscle tone. Coordination normal.  Skin: Skin is warm. He is diaphoretic. No erythema. No pallor.  Psychiatric: Judgment normal. His mood appears anxious. He is slowed. He is not agitated, not aggressive, is not hyperactive and not combative. Thought content is not paranoid and not delusional. He exhibits a depressed mood. He expresses suicidal ideation. He expresses no homicidal ideation. He expresses no suicidal plans and no homicidal plans.          Assessment & Plan:

## 2011-07-30 NOTE — ED Notes (Signed)
Pt requests to speak with the assessments. ACT team notified.

## 2011-07-30 NOTE — BH Assessment (Addendum)
Assessment Note   Richard Rojas is an 21 y.o. male. Pt was sent over by HIV Dr after appt today.  Pt reports he was diagnosed with HIV in March 2013 and has been very depressed since.  Pt reports he has stopped his social life completely, never leaves the house, and mostly stays in his bedroom.  Pt admits to SI on numerous recent occasions.  Pt denies SI today, states he last thought of killing himself three days ago when he considered a plan of overdose or cutting his wrist.  Pt reports most days he hopes he does not wake up in the morning or that his medicine will accidentally poisen him. Pt reports that he cannot contract for safety at this point.  Pt attempted suicide one time previous after back surgery for scoliosis 2 years ago.  Pt continues to have issues with his back.  Pt denies HI/AV.  Pt has no previous psych treatment.  Pt does smoke marijuana 3 times/week because it makes his back feel better.  Pt reports that his mother normally helps him get his socks on and sometimes with bathing due to his back.  Pt has rods in back from scoliosis surgery.  PT reports he is able to do these tasks by himself if needed usually.    Axis I: Major Depression, single episode Axis II: Deferred Axis III:  Past Medical History  Diagnosis Date  . Scoliosis   . Syphilis 2012, june    not clear he completed treatment.  non-compliant with follow up.  . Syphilis 08/09/2010  . HIV (human immunodeficiency virus infection)    Axis IV: recent HIV diagnosis Axis V: 31-40 impairment in reality testing  Past Medical History:  Past Medical History  Diagnosis Date  . Scoliosis   . Syphilis 2012, june    not clear he completed treatment.  non-compliant with follow up.  . Syphilis 08/09/2010  . HIV (human immunodeficiency virus infection)     Past Surgical History  Procedure Date  . Spine surgery     harrington rod placement for scoliosis  . Back surgery     Family History: No family history on  file.  Social History:  reports that he has been smoking Cigarettes.  He has a .6 pack-year smoking history. He has never used smokeless tobacco. He reports that he uses illicit drugs (Marijuana) about twice per week. He reports that he does not drink alcohol.  Additional Social History:  Alcohol / Drug Use Pain Medications: na Prescriptions: na Over the Counter: na History of alcohol / drug use?: Yes Longest period of sobriety (when/how long): na Substance #1 Name of Substance 1: marijuana 1 - Age of First Use: 15 1 - Amount (size/oz): 2-3 blunts 1 - Frequency: 3x week 1 - Duration: 3 years 1 - Last Use / Amount: 5/13 <1 blunt Allergies: No Known Allergies  Home Medications:  (Not in a hospital admission)  OB/GYN Status:  No LMP for male patient.  General Assessment Data Location of Assessment: [facility] ED ACT Assessment: Yes Living Arrangements: Parent Can pt return to current living arrangement?: Yes Admission Status: Voluntary     Risk to self Suicidal Ideation: No-Not Currently/Within Last 6 Months Suicidal Intent: No Is patient at risk for suicide?: Yes Suicidal Plan?: No-Not Currently/Within Last 6 Months (cut wrist, OD) Access to Means: Yes Specify Access to Suicidal Means: knife, pills in the home What has been your use of drugs/alcohol within the last 12 months?: current marijuana  user Previous Attempts/Gestures: No How many times?: 1  Triggers for Past Attempts: Other (Comment) (recent back surgery, lonely) Intentional Self Injurious Behavior: None Family Suicide History: No Recent stressful life event(s): Other (Comment) (diagnosed with HIV March 2013, also scoliosis) Persecutory voices/beliefs?: No Depression: Yes Depression Symptoms: Insomnia;Tearfulness;Isolating;Fatigue;Loss of interest in usual pleasures;Feeling worthless/self pity;Feeling angry/irritable Substance abuse history and/or treatment for substance abuse?: Yes Suicide prevention information  given to non-admitted patients: Not applicable  Risk to Others Homicidal Ideation: No Thoughts of Harm to Others: No Current Homicidal Intent: No Current Homicidal Plan: No Access to Homicidal Means: No History of harm to others?: No Assessment of Violence: None Noted Does patient have access to weapons?: No Criminal Charges Pending?: No Does patient have a court date: No  Psychosis Hallucinations: None noted Delusions: None noted  Mental Status Report Appear/Hygiene: Other (Comment) (casual) Eye Contact: Good Motor Activity: Unremarkable Speech: Logical/coherent Level of Consciousness: Alert Mood: Depressed Affect: Appropriate to circumstance Anxiety Level: None Thought Processes: Coherent;Relevant Judgement: Unimpaired Orientation: Person;Place;Time;Situation Obsessive Compulsive Thoughts/Behaviors: None  Cognitive Functioning Concentration: Normal Memory: Recent Intact;Remote Intact IQ: Average Insight: Good Impulse Control: Fair Appetite: Fair Weight Loss: 0  Weight Gain: 0  Sleep: Decreased Total Hours of Sleep: 4  Vegetative Symptoms: None  Prior Inpatient Therapy Prior Inpatient Therapy: No  Prior Outpatient Therapy Prior Outpatient Therapy: No  ADL Screening (condition at time of admission) Patient's cognitive ability adequate to safely complete daily activities?: Yes Patient able to express need for assistance with ADLs?: Yes Independently performs ADLs?: Yes Weakness of Legs: None Weakness of Arms/Hands: None       Abuse/Neglect Assessment (Assessment to be complete while patient is alone) Physical Abuse: Denies Verbal Abuse: Denies Sexual Abuse: Denies Exploitation of patient/patient's resources: Denies Self-Neglect: Denies     Merchant navy officer (For Healthcare) Advance Directive: Patient does not have advance directive;Patient would like information    Additional Information 1:1 In Past 12 Months?: No CIRT Risk: No Elopement  Risk: No Does patient have medical clearance?: Yes     Disposition: Referred for inpt psych to [facility]. Disposition Disposition of Patient: Inpatient treatment program Type of inpatient treatment program: Adult  On Site Evaluation by:   Reviewed with Physician:     Lorri Frederick 07/30/2011 7:13 PM

## 2011-07-30 NOTE — ED Notes (Signed)
Waiting for  A room

## 2011-07-30 NOTE — ED Notes (Signed)
Security notifed to wand pt. 

## 2011-07-30 NOTE — ED Notes (Signed)
SW in to speak with pt; awaiting room assignment in yellow zone; yellow zone nurses aware of same

## 2011-07-30 NOTE — Assessment & Plan Note (Signed)
Vigorous immune reconstitution with cd4 up from 190 to 770. DC azithromycin and dc bactrim

## 2011-07-30 NOTE — ED Notes (Signed)
Pt Belongings bag in locker number TWO

## 2011-07-30 NOTE — ED Provider Notes (Signed)
History     CSN: [id number]  Arrival date & time 07/30/11  1635   First MD Initiated Contact with Patient 07/30/11 1734      Chief Complaint  Patient presents with  . Suicidal    (Consider location/radiation/quality/duration/timing/severity/associated sxs/prior treatment) HPI Comments: Patient with history of AIDS presents with depression and suicidal ideation. Sent from his infectious disease doctor. History of past suicide attempt by attempted overdose. He has thought about cutting his wrists. He has been compliant with HAART and has had very good response. Patient denies other acute medical issues at this point.   Patient is a 21 y.o. male presenting with mental health disorder. The history is provided by the patient.  Mental Health Problem  Additional symptoms of the illness do not include no headaches or no abdominal pain. He admits to suicidal ideas. He does have a plan to commit suicide. He contemplates harming himself. He has not already injured self. He does not contemplate injuring another person. He has not already  injured another person.    Past Medical History  Diagnosis Date  . Scoliosis   . Syphilis 2012, june    not clear he completed treatment.  non-compliant with follow up.  . Syphilis 08/09/2010  . HIV (human immunodeficiency virus infection)     Past Surgical History  Procedure Date  . Spine surgery     harrington rod placement for scoliosis  . Back surgery     No family history on file.  History  Substance Use Topics  . Smoking status: Current Some Day Smoker -- 0.3 packs/day for 2 years    Types: Cigarettes  . Smokeless tobacco: Never Used  . Alcohol Use: No      Review of Systems  Constitutional: Negative for fever.  HENT: Negative for sore throat and rhinorrhea.   Eyes: Negative for redness.  Respiratory: Negative for cough.   Cardiovascular: Negative for chest pain.  Gastrointestinal: Positive for nausea (occasional). Negative for  vomiting, abdominal pain and diarrhea.  Genitourinary: Negative for dysuria.  Musculoskeletal: Negative for myalgias.  Skin: Negative for rash.  Neurological: Negative for headaches.  Psychiatric/Behavioral: Positive for suicidal ideas. Negative for self-injury.    Allergies  Review of patient's allergies indicates no known allergies.  Home Medications   Current Outpatient Rx  Name Route Sig Dispense Refill  . EFAVIRENZ-EMTRICITAB-TENOFOVIR 600-200-300 MG PO TABS Oral Take 1 tablet by mouth at bedtime. 30 tablet 11  . ONDANSETRON HCL 4 MG PO TABS Oral Take 4 mg by mouth 4 (four) times daily as needed. For nausea    . PANTOPRAZOLE SODIUM 40 MG PO TBEC Oral Take 1 tablet (40 mg total) by mouth daily at 12 noon. 30 tablet 0    BP 113/84  Pulse 88  Temp(Src) 98 F (36.7 C) (Oral)  Resp 19  SpO2 100%  Physical Exam  Nursing note and vitals reviewed. Constitutional: He is oriented to person, place, and time. He appears well-developed and well-nourished.  HENT:  Head: Normocephalic and atraumatic.  Eyes: Conjunctivae are normal. Right eye exhibits no discharge. Left eye exhibits no discharge.  Neck: Normal range of motion. Neck supple.  Cardiovascular: Normal rate, regular rhythm and normal heart sounds.   Pulmonary/Chest: Effort normal and breath sounds normal.  Abdominal: Soft. There is no tenderness.  Neurological: He is alert and oriented to person, place, and time.  Skin: Skin is warm and dry.  Psychiatric: His affect is blunt. He exhibits a depressed mood.  ED Course  Procedures (including critical care time)  Labs Reviewed  DIFFERENTIAL - Abnormal; Notable for the following:    Neutrophils Relative 36 (*)    Neutro Abs 1.5 (*)    Lymphocytes Relative 51 (*)    All other components within normal limits  COMPREHENSIVE METABOLIC PANEL - Abnormal; Notable for the following:    Total Bilirubin 0.2 (*)    All other components within normal limits  URINE RAPID DRUG  SCREEN (HOSP PERFORMED) - Abnormal; Notable for the following:    Tetrahydrocannabinol POSITIVE (*)    All other components within normal limits  CBC  ETHANOL  URINALYSIS, ROUTINE W REFLEX MICROSCOPIC   No results found.   1. Suicidal ideation   2. Depression     6:01 PM Patient seen and examined. Work-up initiated. Holding orders written.   Vital signs reviewed and are as follows: Filed Vitals:   07/30/11 1643  BP: 113/84  Pulse: 88  Temp: 98 F (36.7 C)  Resp: 19   6:41 PM Spoke with Tammy Sours with ACT team. They will see patient and evaluate. He is medically cleared.   10:59 PM Hand-off to Dr. Norlene Campbell.    MDM  Pending ACT eval.         Renne Crigler, PA 07/30/11 2302

## 2011-07-30 NOTE — ED Notes (Signed)
Pt requesting to leave. Dr. Fonnie Jarvis informed

## 2011-07-30 NOTE — Assessment & Plan Note (Signed)
Likely due to anxiety vs IRIS

## 2011-07-30 NOTE — ED Notes (Signed)
Security notifed to wand pt.

## 2011-07-30 NOTE — Patient Instructions (Signed)
We need to admit you to behavioral health for treatment of your severe depression  I think we should consider chaning your atripla to stribild to see if removing the vivid dreams helps you

## 2011-07-31 ENCOUNTER — Telehealth: Payer: Self-pay | Admitting: *Deleted

## 2011-07-31 ENCOUNTER — Telehealth: Payer: Self-pay | Admitting: Licensed Clinical Social Worker

## 2011-07-31 DIAGNOSIS — F329 Major depressive disorder, single episode, unspecified: Secondary | ICD-10-CM

## 2011-07-31 MED ORDER — CITALOPRAM HYDROBROMIDE 20 MG PO TABS: 20.0000 mg | ORAL_TABLET | Freq: Every day | ORAL | Status: DC

## 2011-07-31 MED ORDER — EFAVIRENZ-EMTRICITAB-TENOFOVIR 600-200-300 MG PO TABS
1.0000 | ORAL_TABLET | Freq: Every day | ORAL | Status: DC
  Administered 2011-07-31: 1 via ORAL
  Filled 2011-07-31: qty 1

## 2011-07-31 MED ORDER — ONDANSETRON 4 MG PO TBDP: 8.0000 mg | ORAL_TABLET | Freq: Once | ORAL | Status: DC

## 2011-07-31 NOTE — ED Provider Notes (Signed)
Pt seen by [facility] recs discharge, Pt seen by me, denies SI now, states had fleeting thoughts but no serious intent and does not want to harm self or others now.  States was just stressed out for a while but is better now.  Pt feels improved after observation and/or treatment in ED.The patient appears reasonably screened and/or stabilized for discharge and I doubt any other medical condition or other [facility] requiring further screening, evaluation, or treatment in the ED at this time prior to discharge.  Hurman Horn, MD 08/02/11 [phone]

## 2011-07-31 NOTE — ED Notes (Signed)
Patient using nursing hip phone. Patient speaking loudly and cursing on phone.  Informed patient that other patients were upset regarding patient's language.  Phone taken away due to patient's continued language usage.  Patient wanting to leave, awaiting discharge papers.  RN Verlene Mayer informed

## 2011-07-31 NOTE — BH Assessment (Signed)
The patient requested discharge reporting that he thought he was going to be sent home.  Clinician spoke with initial counselor, Nobie Putnam who reports that the patient did deny current SI, but stated that he could not contract for safety.  Clinician confronted patient with this information and he said, "i don't remember saying that."  This writer asked patient about his previous attempt and his thoughts of harming himself 3 days ago adn what keeps him from acting on those thoughts.  He reports, I have too much to live for.  I have family that's important to me.  I'm supposed to follow up with the counselor at my doctor's office."  Clinician consulted with EDP Bednar who met with the patient and agreed he was able to sign a safety contract.  Pt contracts for safety and was given referrals for outpatient follow up.    Disposition:  Disposition Disposition of Patient: Outpatient treatment;Other dispositions Type of inpatient treatment program: Adult Other disposition(s): Information only  On Site Evaluation by:   Reviewed with Physician:     Steward Ros 07/31/2011 1:53 AM

## 2011-07-31 NOTE — Discharge Instructions (Signed)
You have signs of possible anxiety and/or depression. This is a very common problem.  Be sure to call your caregiver and arrange for follow-up care as suggested by our staff.  RETURN IMMEDIATELY IF DEVELOP threat to harm self or others, suicidal or homicidal thoughts, hallucinations or confusion, unable to be cared for at home or uncontrolled behavior, or other concerns.Deep Brain Stimulation Deep brain stimulation is used to treat certain medical conditions that do not respond to conventional medical treatments. An electric current is sent to an area of the brain that is causing problems. A device that generates this electric current sends it in pulses to a specific area deep inside the brain. Deep brain stimulation has been used to treat several medical conditions. It was first used to treat pain when medicines and other traditional therapies did not work. More recently, it has been used to treat people with medicine-resistant movement disorders, including Parkinson's disease, essential tremor, epilepsy, rare inherited disorders of muscle movement (dystonias), cluster headaches, and multiple sclerosis. Deep brain stimulation is also being used to treat cases of obsessive-compulsive disorder and depression that fail to respond to conventional treatments, such as medicine and shock therapy (electroconvulsive therapy). Deep brain stimulation is a safe and convenient treatment. It can be used 24 hours per day. The intensity and frequency of the pulses can be adjusted. It can be easily discontinued at any time. Medicines and other treatments can be used at the same time as deep brain stimulation. Generally, deep brain stimulation can be used without causing permanent damage to brain tissue. The deep brain stimulation device has 3 parts:  A lead. This is a thin wire. It goes through a small opening in the skull. It delivers the electric pulse.   A power source. This is called the neurotransmitter. It is  usually placed under the skin in the upper part of the chest, similar to how a heart pacemaker is inserted. It is powered by a long-lasting battery.   An extension. This is a wire that connects the lead to the power source. The extension is passed under the skin of the head and neck and down to the power source.  The electric pulses are automatic. The timing is set before the device is placed in the body. A computer can send a radio signal to the neurotransmitter if the frequency of the pulse needs to be adjusted. A hand-held controller can be used to turn the neurotransmitter off. This is done if side effects of treatment are bad or if battery power needs to be conserved. PROCEDURE The surgery to insert a deep brain stimulation device is done in 2 parts:  The lead is inserted:   Numbing medicine (local anesthetic) is injected into your scalp. You are awake but feel no pain.   A small hole is made in your skull.   A computer is used to make a map of your brain. This shows the part of the brain that needs to be treated.   Your caregiver will ask you various questions. This helps the surgeon find the most appropriate part of the brain to place the lead.   The neurotransmitter and extension wire are inserted:   Medicine to make you go to sleep (general anesthetic) is used for this part of the procedure.   A small opening is made in the skin behind your ear. The extension wire is inserted through this opening.   A second opening is made in the upper part of your chest.  This is for the neurotransmitter.  RISKS AND COMPLICATIONS The procedure to insert a deep brain stimulation device is generally safe. However, as with all surgical procedures, there are some risks. These risks may be greater for the following people:  People older than 70 years.   People with blood vessel disease.   People receiving antiplatelet or anticoagulant therapy.   People with unstable medical conditions, such as  severe coronary artery disease, severe hypertension, uncontrolled diabetes, previous stroke, heart failure, and severe dementia.  If complications occur, they are usually temporary. They may include bleeding, leaking of fluid from around the brain, and infection. Possible side effects from insertion of the device include:  Temporary tingling in the face, arms, or legs.   Pain or swelling in the areas where the wires are placed.   An allergic reaction to parts of the device.   Slight problems with vision or speech.   Slight problem with balance.   Slight loss of movement.   Some jolting or shocking sensations.  HOME CARE INSTRUCTIONS  Understand how to operate the device. Be sure to ask whether it is okay to turn it off at night.   Ask if there are any times or places when the device should not be turned on. Electrical devices at home will not affect the device. This includes microwaves and computers.   Continue to take any medicine prescribed by your caregiver. Do not start taking any new medicine unless your caregiver says it is okay. This includes over-the-counter medicines.   Keep all follow-up appointments. The device will need to be checked periodically by your caregiver. Batteries usually last for 3 to 5 years.  FOR MORE INFORMATION American Association of Neu[employer]geons: www.aans.org Document Released: 06/18/2010 Document Revised: 02/20/2011 Document Reviewed: 06/18/2010 [facility] Patient Information 2012 [place], [place].

## 2011-07-31 NOTE — Telephone Encounter (Signed)
Patient called stating that he was discharged from ED for mental health evaluation last night and he was not given anything for his depression or set up for outpatient therapy. He wants to know if Dr. Daiva Eves will give him something for anxiety and depression.  Per Dr. Daiva Eves he can start Celexa 20mg  daily, will consider later starting something for anxiety, but for now just the antidepressive which may help with anxiety also.

## 2011-07-31 NOTE — Telephone Encounter (Signed)
Tamika, CMA has spoken with this pt & is waiting for Dr. Daiva Eves to call her back.

## 2011-07-31 NOTE — Telephone Encounter (Signed)
Front office requested I call him back at [phone]. I did & there was no answer. I called the cell & no one answered. I called the home number & spoke with his mother. I asked to have him call us if she speaks with him. She gave me there same number we had [phone]. I will continue to try

## 2011-08-01 NOTE — Telephone Encounter (Signed)
As discussed ssri in celexa

## 2011-08-02 NOTE — ED Provider Notes (Signed)
Medical screening examination/treatment/procedure(s) were conducted as a shared visit with non-physician practitioner(s) and myself.  I personally evaluated the patient during the encounter  Hurman Horn, MD 08/02/11 [phone]

## 2011-08-25 ENCOUNTER — Telehealth: Payer: Self-pay | Admitting: Licensed Clinical Social Worker

## 2011-08-25 NOTE — Telephone Encounter (Signed)
Patient called stating that he has severe rectal pain along with constipation. He felt 1 "golf ball sized knot" in that area and 1 little one. He states he have tried otc laxatives and nothing has helped. He is worried about the knots that he feels in his rectum and asked for an appointment. He has one for tomorrow at 3 pm with Dr. Orvan Falconer.

## 2011-08-26 ENCOUNTER — Ambulatory Visit (INDEPENDENT_AMBULATORY_CARE_PROVIDER_SITE_OTHER): Payer: Medicaid Other | Admitting: Internal Medicine

## 2011-08-26 ENCOUNTER — Encounter: Payer: Self-pay | Admitting: Internal Medicine

## 2011-08-26 VITALS — BP 126/76 | HR 91 | Temp 98.5°F | Wt 133.0 lb

## 2011-08-26 DIAGNOSIS — K649 Unspecified hemorrhoids: Secondary | ICD-10-CM

## 2011-08-26 DIAGNOSIS — K5900 Constipation, unspecified: Secondary | ICD-10-CM

## 2011-08-26 MED ORDER — PHENYLEPHRINE IN HARD FAT 0.25 % RE SUPP: 1.0000 | Freq: Two times a day (BID) | RECTAL | Status: AC

## 2011-08-26 MED ORDER — BISACODYL 5 MG PO TBEC: 5.0000 mg | DELAYED_RELEASE_TABLET | Freq: Every day | ORAL | Status: AC | PRN

## 2011-08-26 NOTE — Progress Notes (Signed)
Patient ID: Richard Rojas, male   DOB: Sep 10, 1990, 21 y.o.   MRN: [id number]     [facility] for Infectious Disease  Patient Active Problem List  Diagnoses  . SCOLIOSIS  . GERD (gastroesophageal reflux disease)  . Unprotected sexual intercourse  . HIV (human immunodeficiency virus infection)  . Syphilis  . Nausea  . Suicidal ideation  . Severe depression  . Constipation  . Hemorrhoids    Patient's Medications  New Prescriptions   BISACODYL (BISACODYL) 5 MG EC TABLET    Take 1 tablet (5 mg total) by mouth daily as needed for constipation.   PHENYLEPHRINE (,USE FOR PREPARATION-H,) 0.25 % SUPPOSITORY    Place 1 suppository rectally 2 (two) times daily.  Previous Medications   CITALOPRAM (CELEXA) 20 MG TABLET    Take 1 tablet (20 mg total) by mouth daily.   EFAVIRENZ-EMTRICTABINE-TENOFOVIR (ATRIPLA) 600-200-300 MG PER TABLET    Take 1 tablet by mouth at bedtime.   ONDANSETRON (ZOFRAN) 4 MG TABLET    Take 4 mg by mouth 4 (four) times daily as needed. For nausea   PANTOPRAZOLE (PROTONIX) 40 MG TABLET    Take 1 tablet (40 mg total) by mouth daily at 12 noon.  Modified Medications   No medications on file  Discontinued Medications   No medications on file    Subjective: Richard Rojas is seeing a work in basis. He started having problems with constipation several weeks ago and then began having severe her perirectal pain 3 days ago. He has not noted any drainage or bleeding. He denies missing any doses of his Atripla. He states that he is taking 6 medications including one new one for depression but he does not know the names of all of his medicines.  Objective: Temp: 98.5 F (36.9 C) (06/11 1514) Temp src: Oral (06/11 1514) BP: 126/76 mmHg (06/11 1514) Pulse Rate: 91  (06/11 1514)  General: He is uncomfortable due to pain Skin: No rash Abdomen: Soft and nontender Rectal: He has a large hemorrhoid at the 6:00 position without any bleeding or ulceration  Lab Results HIV 1 RNA  Quant (copies/mL)  Date Value  07/16/2011 25*  05/20/2011 6407*     CD4 T Cell Abs (cmm)  Date Value  07/16/2011 770   05/19/2011 190*     Assessment: Constipation complicated by acute hemorrhoid flare  Plan: 1. I asked him to call back with the names of all of his medications 2. Dulcolax daily for constipation 3. Preparation H suppository   Richard Asters, MD [facility] for Infectious Disease [facility] [phone] pager   [phone] cell 08/26/2011, 3:30 PM

## 2011-08-27 ENCOUNTER — Ambulatory Visit: Payer: Self-pay | Admitting: Family Medicine

## 2011-10-09 ENCOUNTER — Other Ambulatory Visit: Payer: Self-pay | Admitting: *Deleted

## 2011-10-10 ENCOUNTER — Other Ambulatory Visit: Payer: Self-pay | Admitting: Sports Medicine

## 2011-10-10 MED ORDER — PANTOPRAZOLE SODIUM 40 MG PO TBEC: 40.0000 mg | DELAYED_RELEASE_TABLET | Freq: Every day | ORAL | Status: DC

## 2011-10-24 ENCOUNTER — Telehealth: Payer: Self-pay | Admitting: Licensed Clinical Social Worker

## 2011-10-24 NOTE — Telephone Encounter (Signed)
Patient called stating he is very angry all the time and he is afraid of what he may do to someone else. He started Celexa several months ago for depression and suicidal ideation, but now has started feeling angry since starting. I made the patient an appointment on 10/28/2011 with Dr. Drue Second and he also agreed to meet with Franne Forts our counselor. I advised him to access behavioral medicine at the hospital or call the mobile crisis team if his emotions get out of hand and he can't control himself. He said he would and he would like to stay in his room for the weekend so he will not be around anyone.

## 2011-10-27 ENCOUNTER — Ambulatory Visit (INDEPENDENT_AMBULATORY_CARE_PROVIDER_SITE_OTHER): Payer: Medicaid Other | Admitting: Family Medicine

## 2011-10-27 ENCOUNTER — Encounter: Payer: Self-pay | Admitting: Family Medicine

## 2011-10-27 VITALS — BP 112/67 | HR 99 | Temp 99.0°F | Ht 74.0 in | Wt 139.2 lb

## 2011-10-27 DIAGNOSIS — G8929 Other chronic pain: Secondary | ICD-10-CM | POA: Insufficient documentation

## 2011-10-27 DIAGNOSIS — M549 Dorsalgia, unspecified: Secondary | ICD-10-CM

## 2011-10-27 MED ORDER — MELOXICAM 15 MG PO TABS: 15.0000 mg | ORAL_TABLET | Freq: Every day | ORAL | Status: DC

## 2011-10-27 NOTE — Patient Instructions (Signed)
We will get you set up with physical therapy today.  Take the Mobic for pain relief.

## 2011-10-27 NOTE — Assessment & Plan Note (Addendum)
Some discrepancy between actual signs noted on examination and degree of patient's stated pain.   Discussed that we should try several approaches to alleviating pain and returning function before thinking about something drastic like disability. Order placed for physical therapy today. Mobic today to help with pain relief.  He is not taking anything currently.  May also benefit from back brace.  Sports Medicine or Orthopedic referral (patient does NOT desire any further surgery) might be appropriate to see if they have any other ideas in this patient with previous spinal surgery.   FU with PCP in 2-3 weeks to assess for improvement after PT.

## 2011-10-28 ENCOUNTER — Encounter: Payer: Self-pay | Admitting: Internal Medicine

## 2011-10-28 ENCOUNTER — Ambulatory Visit (INDEPENDENT_AMBULATORY_CARE_PROVIDER_SITE_OTHER): Payer: Medicaid Other | Admitting: Internal Medicine

## 2011-10-28 ENCOUNTER — Encounter: Payer: Self-pay | Admitting: Family Medicine

## 2011-10-28 VITALS — BP 118/79 | HR 73 | Temp 97.4°F | Wt 137.0 lb

## 2011-10-28 DIAGNOSIS — B20 Human immunodeficiency virus [HIV] disease: Secondary | ICD-10-CM

## 2011-10-28 DIAGNOSIS — F329 Major depressive disorder, single episode, unspecified: Secondary | ICD-10-CM

## 2011-10-28 DIAGNOSIS — Z21 Asymptomatic human immunodeficiency virus [HIV] infection status: Secondary | ICD-10-CM

## 2011-10-28 MED ORDER — QUETIAPINE FUMARATE 100 MG PO TABS
100.0000 mg | ORAL_TABLET | Freq: Every day | ORAL | Status: DC
Start: 2011-10-28 — End: 2011-12-17

## 2011-10-28 MED ORDER — ELVITEG-COBIC-EMTRICIT-TENOFDF 150-150-200-300 MG PO TABS: 1.0000 | ORAL_TABLET | Freq: Every day | ORAL | Status: DC

## 2011-10-28 NOTE — Progress Notes (Signed)
  Subjective:    Patient ID: Richard Rojas, male    DOB: 12/11/1990, 21 y.o.   MRN: [id number]  HPI 1.  Back pain:  21 year old male s/p scoliosis surgical repair in 2009 performed at [facility] who presents today with back pain of several years' duration.  He describes pain as 10/10 mostly in lower back and also in upper back between his shoulder blades at all times.  He is able to sleep at night but sometimes awakens due to pain.  No bladder, bowel incontinence.  Non-radiating pain, but does feel his Left thigh is a little weak compared to his Right.  Patient states that his posture is worsening due to his pain.  Not currently working, states that back hurts too bad to remain standing for long periods of time.    Never had physical therapy after his surgery, doesn't know why.  No fevers or chills.    Patient states that previous PCP was going to write for him to receive disability, but that PCP has since left the practice.     Review of Systems See HPI above for review of systems.      Objective:   Physical Exam Gen:  Pt lying on exam table during interview, but easily up, able to sit, stand, walk around room, pull up his shirt without pain when I asked him to. Back:  Large midline scar over spine extending from base of neck to lumbar region of spine. Spine with normal alignment.  No tenderness to vertebral process palpation.  Paraspinous muscles are somewhat tender in lumbar region.   Range of motion is full at neck, minimally decreased forward extension in lumbar sacral regions.  Straight leg raise is negative for back pain. Neuro:  Sensation and motor function 5/5 bilateral lower extremities, except for Left hip flexor which is 4/5 strength.  Patellar and Achilles  DTR's +2 patellar BL         Assessment & Plan:

## 2011-10-28 NOTE — Progress Notes (Signed)
INFECTIOUS DISEASES CLINIC  RFV: unhinged; angry, poor sleep Subjective:    Patient ID: Richard Rojas, male    DOB: December 21, 1990, 21 y.o.   MRN: [id number]  HPI Richard Rojas is a 21yo Male with HIV and depression, CD 4 count of 770, VL 25 currently on Atripla. He mentions that he is having difficulty sleeping, having significant depression, angry and frustrated most of the time. He has been on celexa for which he feels this is not working for him. He denies any suicide ideation or thoughts of hurting others. He has not been connected yet with mental health.  During this visit, we have had him meet with Bernette Redbird, clinic counselor who is going to assist patient in seeing psychiatrist.  Current Outpatient Prescriptions on File Prior to Visit  Medication Sig Dispense Refill  . citalopram (CELEXA) 20 MG tablet Take 1 tablet (20 mg total) by mouth daily.  30 tablet  3  . efavirenz-emtrictabine-tenofovir (ATRIPLA) 600-200-300 MG per tablet Take 1 tablet by mouth at bedtime.  30 tablet  11  . meloxicam (MOBIC) 15 MG tablet Take 1 tablet (15 mg total) by mouth daily.  30 tablet  0  . ondansetron (ZOFRAN) 4 MG tablet Take 4 mg by mouth 4 (four) times daily as needed. For nausea      . pantoprazole (PROTONIX) 40 MG tablet Take 1 tablet (40 mg total) by mouth daily at 12 noon.  30 tablet  0     Review of Systems     Objective:   Physical Exam BP 118/79  Pulse 73  Temp 97.4 F (36.3 C) (Oral)  Wt 137 lb (62.143 kg) gen = visibly distraught, pressured speech       Assessment & Plan:  HIV = concern that Atripla CNS side effects is exacerbating his sleep cycle -> worsening his depression/mood. We will switch the patient to stribild.  Depression = the patient hints that since starting celexa that his symptoms are worse, which can occur with SSRI but I also wonder if patient may have other psychiatric diagnosis, ? Bipolar disorder, and would benefit from seeing psychiatrist to help determine appropriate  medication. Will add patient on seroquel 100mg  to help with sleep but the intent is to have him go to psychiatrist who can titrate up vs. Starting mood stabilizer. Will instruct patient how to taper off of celexa.  Follow up in 1 wk with clinic counselor and 1 month with clinic appt

## 2011-11-04 ENCOUNTER — Telehealth: Payer: Self-pay | Admitting: Sports Medicine

## 2011-11-04 ENCOUNTER — Telehealth: Payer: Self-pay | Admitting: *Deleted

## 2011-11-04 NOTE — Telephone Encounter (Signed)
Patient called c/o constipation, advised per Dr. Blair Dolphin note to take ducolax stool softener.  He has an appt with Dr. Drue Second on Thursday. Wendall Mola CMA

## 2011-11-04 NOTE — Telephone Encounter (Signed)
Patient is calling to check on status of referral for Physical Therapy. 

## 2011-11-04 NOTE — Telephone Encounter (Signed)
Called [facility] and they tried to call pt on 8.15 and lvm for him to return call. I told her that he just called our office about 10 mins ago. She will try pt again.Loralee Pacas Francis

## 2011-11-06 ENCOUNTER — Ambulatory Visit: Payer: Self-pay

## 2011-11-06 ENCOUNTER — Ambulatory Visit: Payer: Self-pay | Admitting: Internal Medicine

## 2011-11-11 ENCOUNTER — Ambulatory Visit: Payer: Medicaid Other | Attending: Family Medicine | Admitting: Rehabilitation

## 2011-11-11 DIAGNOSIS — M2569 Stiffness of other specified joint, not elsewhere classified: Secondary | ICD-10-CM | POA: Insufficient documentation

## 2011-11-11 DIAGNOSIS — M549 Dorsalgia, unspecified: Secondary | ICD-10-CM | POA: Insufficient documentation

## 2011-11-11 DIAGNOSIS — IMO0001 Reserved for inherently not codable concepts without codable children: Secondary | ICD-10-CM | POA: Insufficient documentation

## 2011-11-19 ENCOUNTER — Encounter: Payer: Self-pay | Admitting: Infectious Disease

## 2011-11-19 ENCOUNTER — Ambulatory Visit: Payer: Medicaid Other | Admitting: Physical Therapy

## 2011-11-19 ENCOUNTER — Other Ambulatory Visit (HOSPITAL_COMMUNITY)
Admission: RE | Admit: 2011-11-19 | Discharge: 2011-11-19 | Disposition: A | Discharge status: Home / Self Care | Payer: Medicaid Other | Source: Ambulatory Visit | Attending: Infectious Disease | Admitting: Infectious Disease

## 2011-11-19 ENCOUNTER — Ambulatory Visit (INDEPENDENT_AMBULATORY_CARE_PROVIDER_SITE_OTHER): Payer: Medicaid Other | Admitting: Infectious Disease

## 2011-11-19 VITALS — BP 112/75 | HR 73 | Temp 98.3°F | Ht 73.0 in | Wt 138.0 lb

## 2011-11-19 DIAGNOSIS — M549 Dorsalgia, unspecified: Secondary | ICD-10-CM

## 2011-11-19 DIAGNOSIS — F329 Major depressive disorder, single episode, unspecified: Secondary | ICD-10-CM

## 2011-11-19 DIAGNOSIS — A539 Syphilis, unspecified: Secondary | ICD-10-CM

## 2011-11-19 DIAGNOSIS — B20 Human immunodeficiency virus [HIV] disease: Secondary | ICD-10-CM

## 2011-11-19 DIAGNOSIS — G8929 Other chronic pain: Secondary | ICD-10-CM

## 2011-11-19 DIAGNOSIS — Z113 Encounter for screening for infections with a predominantly sexual mode of transmission: Secondary | ICD-10-CM | POA: Insufficient documentation

## 2011-11-19 DIAGNOSIS — F322 Major depressive disorder, single episode, severe without psychotic features: Secondary | ICD-10-CM

## 2011-11-19 DIAGNOSIS — Z21 Asymptomatic human immunodeficiency virus [HIV] infection status: Secondary | ICD-10-CM

## 2011-11-19 LAB — CBC WITH DIFFERENTIAL/PLATELET
Basophils Relative: 1 % (ref 0–1)
Hemoglobin: 13.6 g/dL (ref 13.0–17.0)
Lymphs Abs: 1.7 10*3/uL (ref 0.7–4.0)
Monocytes Relative: 12 % (ref 3–12)
Neutro Abs: 1.9 10*3/uL (ref 1.7–7.7)
Neutrophils Relative %: 44 % (ref 43–77)
Platelets: 237 10*3/uL (ref 150–400)
RBC: 4.28 MIL/uL (ref 4.22–5.81)

## 2011-11-19 MED ORDER — KETOROLAC TROMETHAMINE 60 MG/2ML IM SOLN
60.0000 mg | Freq: Once | INTRAMUSCULAR | Status: AC
  Administered 2011-11-19: 60 mg via INTRAMUSCULAR

## 2011-11-19 MED ORDER — OXYCODONE-ACETAMINOPHEN 5-325 MG PO TABS: 1.0000 | ORAL_TABLET | Freq: Two times a day (BID) | ORAL | Status: AC | PRN

## 2011-11-19 NOTE — Assessment & Plan Note (Signed)
This remains a significant problem for him he is in counseling with one of our counselors here in the clinic. He is contracted for safety and is on Seroquel but not Celexa anymore.

## 2011-11-19 NOTE — Assessment & Plan Note (Signed)
Titers appear to have been dropping a properly so far.

## 2011-11-19 NOTE — Assessment & Plan Note (Signed)
Appears to be doing well check viral load and CD4 count today continue spelled STRIBILD

## 2011-11-19 NOTE — Progress Notes (Signed)
Subjective:    Patient ID: Richard Rojas, male    DOB: 1990-06-11, 21 y.o.   MRN: [id number]  HPI  21 year old [place] American male with history of scoliosis status post surgery also with problems with severe depression currently on Celexa and Seroquel. His antiretroviral regimen was changed on Atripla to spell STRIBILD. He seems to be tolerating this new regimen fairly well his viral load was undetectable in July of his CD4 count was above 700. He complains of severe back pain today and is doubled over on the bed. He also appears dysphoric and does endorse depression but denies suicidal or homicidal ideation. He is contracted for safety.  Review of Systems  Constitutional: Negative for fever, chills, diaphoresis, activity change, appetite change, fatigue and unexpected weight change.  HENT: Negative for congestion, sore throat, rhinorrhea, sneezing, trouble swallowing and sinus pressure.   Eyes: Negative for photophobia and visual disturbance.  Respiratory: Negative for cough, chest tightness, shortness of breath, wheezing and stridor.   Cardiovascular: Negative for chest pain, palpitations and leg swelling.  Gastrointestinal: Negative for nausea, vomiting, abdominal pain, diarrhea, constipation, blood in stool, abdominal distention and anal bleeding.  Genitourinary: Negative for dysuria, hematuria, flank pain and difficulty urinating.  Musculoskeletal: Positive for back pain and arthralgias. Negative for myalgias, joint swelling and gait problem.  Skin: Negative for color change, pallor, rash and wound.  Neurological: Negative for dizziness, tremors, weakness and light-headedness.  Hematological: Negative for adenopathy. Does not bruise/bleed easily.  Psychiatric/Behavioral: Positive for dysphoric mood and decreased concentration. Negative for behavioral problems, confusion, disturbed wake/sleep cycle, self-injury and agitation. The patient is nervous/anxious.        Objective:   Physical Exam  Constitutional: He is oriented to person, place, and time. He appears well-developed and well-nourished. No distress.  HENT:  Head: Normocephalic and atraumatic.  Mouth/Throat: Oropharynx is clear and moist. No oropharyngeal exudate.  Eyes: Conjunctivae and EOM are normal. Pupils are equal, round, and reactive to light. No scleral icterus.  Neck: Normal range of motion. Neck supple. No JVD present.  Cardiovascular: Normal rate, regular rhythm and normal heart sounds.  Exam reveals no gallop and no friction rub.   No murmur heard. Pulmonary/Chest: Effort normal and breath sounds normal. No respiratory distress. He has no wheezes. He has no rales. He exhibits no tenderness.  Abdominal: He exhibits no distension and no mass. There is no tenderness. There is no rebound and no guarding.  Musculoskeletal: He exhibits no edema and no tenderness.       Arms: Lymphadenopathy:    He has no cervical adenopathy.  Neurological: He is alert and oriented to person, place, and time. He has normal reflexes. He exhibits normal muscle tone. Coordination normal.  Skin: Skin is warm and dry. He is not diaphoretic. No erythema. No pallor.  Psychiatric: He has a normal mood and affect. His behavior is normal. Judgment and thought content normal.          Assessment & Plan:  HIV (human immunodeficiency virus infection) Appears to be doing well check viral load and CD4 count today continue spelled STRIBILD  Syphilis  Titers appear to have been dropping a properly so far.  Chronic back pain We'll give him some Percocet because his pain currently is not well controlled nonsteroidal anti-inflammatory drugs  Severe depression This remains a significant problem for him he is in counseling with one of our counselors here in the clinic. He is contracted for safety and is on Seroquel but not Celexa  anymore.

## 2011-11-19 NOTE — Assessment & Plan Note (Signed)
We'll give him some Percocet because his pain currently is not well controlled nonsteroidal anti-inflammatory drugs

## 2011-11-20 ENCOUNTER — Ambulatory Visit: Payer: Medicaid Other | Attending: Family Medicine | Admitting: Physical Therapy

## 2011-11-20 DIAGNOSIS — M549 Dorsalgia, unspecified: Secondary | ICD-10-CM | POA: Insufficient documentation

## 2011-11-20 DIAGNOSIS — M2569 Stiffness of other specified joint, not elsewhere classified: Secondary | ICD-10-CM | POA: Insufficient documentation

## 2011-11-20 DIAGNOSIS — IMO0001 Reserved for inherently not codable concepts without codable children: Secondary | ICD-10-CM | POA: Insufficient documentation

## 2011-11-20 LAB — LIPID PANEL
Cholesterol: 133 mg/dL (ref 0–200)
HDL: 34 mg/dL — ABNORMAL LOW (ref >39)
Total CHOL/HDL Ratio: 3.9 Ratio
Triglycerides: 80 mg/dL (ref <150)
VLDL: 16 mg/dL (ref 0–40)

## 2011-11-20 LAB — COMPLETE METABOLIC PANEL WITH GFR
AST: 12 U/L (ref 0–37)
Alkaline Phosphatase: 90 U/L (ref 39–117)
BUN: 11 mg/dL (ref 6–23)
Creat: 1 mg/dL (ref 0.50–1.35)
Total Bilirubin: 0.4 mg/dL (ref 0.3–1.2)

## 2011-11-20 LAB — T.PALLIDUM AB, TOTAL: T pallidum Antibodies (TP-PA): >8 S/CO — ABNORMAL HIGH (ref <0.90)

## 2011-11-20 LAB — RPR TITER: RPR Titer: 1:2 {titer}

## 2011-11-20 LAB — RPR: RPR Ser Ql: REACTIVE — AB

## 2011-11-21 LAB — T-HELPER CELL (CD4) - (RCID CLINIC ONLY): CD4 T Cell Abs: 700 uL (ref 400–2700)

## 2011-11-24 LAB — HIV-1 RNA QUANT-NO REFLEX-BLD
HIV 1 RNA Quant: <20 copies/mL (ref <20)
HIV-1 RNA Quant, Log: <1.3 {Log} (ref <1.30)

## 2011-11-25 ENCOUNTER — Ambulatory Visit: Payer: Medicaid Other | Admitting: Physical Therapy

## 2011-11-27 ENCOUNTER — Ambulatory Visit: Payer: Medicaid Other | Admitting: Physical Therapy

## 2011-11-28 ENCOUNTER — Other Ambulatory Visit: Payer: Self-pay | Admitting: Family Medicine

## 2011-11-30 ENCOUNTER — Emergency Department (HOSPITAL_COMMUNITY): Payer: Medicaid Other

## 2011-11-30 ENCOUNTER — Emergency Department (HOSPITAL_COMMUNITY)
Admission: EM | Admit: 2011-11-30 | Discharge: 2011-11-30 | Disposition: A | Discharge status: Home / Self Care | Payer: Medicaid Other | Attending: Emergency Medicine | Admitting: Emergency Medicine

## 2011-11-30 ENCOUNTER — Encounter (HOSPITAL_COMMUNITY): Payer: Self-pay | Admitting: *Deleted

## 2011-11-30 DIAGNOSIS — G8929 Other chronic pain: Secondary | ICD-10-CM | POA: Insufficient documentation

## 2011-11-30 DIAGNOSIS — B20 Human immunodeficiency virus [HIV] disease: Secondary | ICD-10-CM

## 2011-11-30 DIAGNOSIS — R000 Tachycardia, unspecified: Secondary | ICD-10-CM | POA: Insufficient documentation

## 2011-11-30 DIAGNOSIS — M549 Dorsalgia, unspecified: Secondary | ICD-10-CM | POA: Insufficient documentation

## 2011-11-30 DIAGNOSIS — Z21 Asymptomatic human immunodeficiency virus [HIV] infection status: Secondary | ICD-10-CM | POA: Insufficient documentation

## 2011-11-30 DIAGNOSIS — A539 Syphilis, unspecified: Secondary | ICD-10-CM | POA: Insufficient documentation

## 2011-11-30 DIAGNOSIS — F329 Major depressive disorder, single episode, unspecified: Secondary | ICD-10-CM | POA: Insufficient documentation

## 2011-11-30 DIAGNOSIS — R55 Syncope and collapse: Secondary | ICD-10-CM | POA: Insufficient documentation

## 2011-11-30 DIAGNOSIS — F322 Major depressive disorder, single episode, severe without psychotic features: Secondary | ICD-10-CM

## 2011-11-30 DIAGNOSIS — F3289 Other specified depressive episodes: Secondary | ICD-10-CM | POA: Insufficient documentation

## 2011-11-30 DIAGNOSIS — R0682 Tachypnea, not elsewhere classified: Secondary | ICD-10-CM | POA: Insufficient documentation

## 2011-11-30 LAB — BASIC METABOLIC PANEL
CO2: 26 mEq/L (ref 19–32)
Chloride: 104 mEq/L (ref 96–112)
GFR calc Af Amer: >90 mL/min (ref >90)
Potassium: 3.5 mEq/L (ref 3.5–5.1)

## 2011-11-30 LAB — RAPID URINE DRUG SCREEN, HOSP PERFORMED
Amphetamines: NOT DETECTED
Benzodiazepines: NOT DETECTED
Cocaine: NOT DETECTED

## 2011-11-30 LAB — ETHANOL: Alcohol, Ethyl (B): <11 mg/dL (ref 0–11)

## 2011-11-30 LAB — CBC
HCT: 39.1 % (ref 39.0–52.0)
Platelets: 171 10*3/uL (ref 150–400)
RBC: 4.15 MIL/uL — ABNORMAL LOW (ref 4.22–5.81)
RDW: 12.3 % (ref 11.5–15.5)
WBC: 6.7 10*3/uL (ref 4.0–10.5)

## 2011-11-30 MED ORDER — MELOXICAM 15 MG PO TABS
15.0000 mg | ORAL_TABLET | Freq: Every day | ORAL | Status: DC
  Administered 2011-11-30: 15 mg via ORAL
  Filled 2011-11-30: qty 1

## 2011-11-30 MED ORDER — PANTOPRAZOLE SODIUM 40 MG PO TBEC
40.0000 mg | DELAYED_RELEASE_TABLET | Freq: Every day | ORAL | Status: DC
  Administered 2011-11-30: 40 mg via ORAL
  Filled 2011-11-30: qty 1

## 2011-11-30 MED ORDER — OXYCODONE-ACETAMINOPHEN 5-325 MG PO TABS: 1.0000 | ORAL_TABLET | Freq: Three times a day (TID) | ORAL | Status: DC | PRN

## 2011-11-30 MED ORDER — ZIPRASIDONE MESYLATE 20 MG IM SOLR
INTRAMUSCULAR | Status: AC
  Filled 2011-11-30: qty 20

## 2011-11-30 MED ORDER — ONDANSETRON HCL 4 MG PO TABS: 4.0000 mg | ORAL_TABLET | Freq: Three times a day (TID) | ORAL | Status: DC | PRN

## 2011-11-30 MED ORDER — ELVITEG-COBIC-EMTRICIT-TENOFDF 150-150-200-300 MG PO TABS
1.0000 | ORAL_TABLET | Freq: Every day | ORAL | Status: DC
  Filled 2011-11-30: qty 1

## 2011-11-30 MED ORDER — ZIPRASIDONE MESYLATE 20 MG IM SOLR: 20.0000 mg | Freq: Four times a day (QID) | INTRAMUSCULAR | Status: DC | PRN

## 2011-11-30 MED ORDER — ZIPRASIDONE MESYLATE 20 MG IM SOLR
20.0000 mg | Freq: Once | INTRAMUSCULAR | Status: AC
  Administered 2011-11-30: 20 mg via INTRAMUSCULAR

## 2011-11-30 MED ORDER — QUETIAPINE FUMARATE 100 MG PO TABS
100.0000 mg | ORAL_TABLET | Freq: Every day | ORAL | Status: DC
  Administered 2011-11-30: 100 mg via ORAL
  Filled 2011-11-30: qty 1

## 2011-11-30 NOTE — ED Notes (Signed)
Pt threw food and medical equipment on the floor. Consulting civil engineer and security informed and at bedside.

## 2011-11-30 NOTE — ED Notes (Signed)
Telepysch faxed 

## 2011-11-30 NOTE — [facility] Counselor (Signed)
Dr. Lanell Matar telepsych consult recommends discharge for patient and for pt to continue with his outpatient provider. RN and EDP notified. Pt will be d/c. Pt denies SI and HI and no psychotic symptoms present.

## 2011-11-30 NOTE — ED Notes (Signed)
GEX:BM84<XL> Expected date:<BR> Expected time:<BR> Means of arrival:<BR> Comments:<BR> Medic 231, 21 M,  Altered Mental Status, possible aspiration

## 2011-11-30 NOTE — ED Notes (Signed)
Report received-airway intact-no s/s's of distress-will continue to monitor 

## 2011-11-30 NOTE — ED Notes (Signed)
Lunch tray given to patient he states " uh uh I'm not hungry". Left tray at bedside in case he decides he wants to eat.

## 2011-11-30 NOTE — ED Notes (Signed)
Pt was in custody of [employer],  Pt reported to become projectile vomiting,  Then passing out,  Pt arrives via EMS with [employer] at side,  Pt is alert but not coherent,  Pt is uncooperative, pupils dilated

## 2011-11-30 NOTE — ED Notes (Signed)
Fax paper work confirmed for [employer]

## 2011-11-30 NOTE — ED Provider Notes (Signed)
11:32 AM The patient awakens easily.  He states that he has no new physical complaints or pain.  He denies any use of mind altering substances yesterday, any alcohol.  He states that he is currently in a disagreement with his mother.  He states that he has violent intentions towards her, beyond simple thoughts.  Given the patient's description of violent intentions, he will be evaluated by our psychiatric team.  Gerhard Munch, MD 11/30/11 [phone]

## 2011-11-30 NOTE — ED Notes (Signed)
Informed ACT team that patient was alert/oriented and willing to engage in therapeutic interaction

## 2011-11-30 NOTE — ED Notes (Addendum)
Pt in from home via EMS. Per EMS, [employer] called to treat pt. Pt was altered upon arrival, anxious. Pt status became worse prior to transit, pt loss consciousness. EMS reports projectile vomiting.
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
## 2011-11-30 NOTE — ED Notes (Signed)
Asked charge, RN if pt needed to have a sitter. Charge stated he needed to be assessed for one. Pt responded saying, "Sometimes I am. Sometimes I'm not." when questioned about SI. When asked about today, pt states "I'd hurt the next person before hurting myself." Informed AC. No sitter needed.

## 2011-12-02 ENCOUNTER — Ambulatory Visit: Payer: Medicaid Other | Admitting: Physical Therapy

## 2011-12-04 ENCOUNTER — Ambulatory Visit: Payer: Medicaid Other | Admitting: Physical Therapy

## 2011-12-09 ENCOUNTER — Ambulatory Visit: Payer: Medicaid Other

## 2011-12-09 DIAGNOSIS — F331 Major depressive disorder, recurrent, moderate: Secondary | ICD-10-CM

## 2011-12-09 DIAGNOSIS — F4310 Post-traumatic stress disorder, unspecified: Secondary | ICD-10-CM
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
## 2011-12-11 ENCOUNTER — Telehealth: Payer: Self-pay | Admitting: *Deleted

## 2011-12-11 NOTE — Telephone Encounter (Signed)
No clear indication of what the pt needs or wants.  Message left for pt to call back again.

## 2011-12-12 ENCOUNTER — Telehealth: Payer: Self-pay | Admitting: *Deleted

## 2011-12-12 NOTE — Telephone Encounter (Signed)
Patient requesting appt for peeling palms and gray hairs on his groin.  He wanted treatment for syphilis which according to Dr. Clinton Gallant office note his titers are appropriately coming down.  We did not diagnose him the [facility] did, and treated him with 1 dose of penicillin.  He was angry on the phone, and said he was about to cuss me out.  I offered him an AM appt on Tuesday with Dr. Ninetta Lights, he said he was not a morning person and needed afternoon. I offered 12/23/11 afternoon with Dr. Daiva Eves and he said he could not wait that long. Scheduled for 12/17/11 at 8:45 AM, he said he would try to make it. Wendall Mola

## 2011-12-15 ENCOUNTER — Telehealth: Payer: Self-pay | Admitting: *Deleted

## 2011-12-15 NOTE — Telephone Encounter (Signed)
Patient called to check his appt times advised him 12/16/11 230 and 12/17/11 845. He had them wrong in his date book.

## 2011-12-16 ENCOUNTER — Ambulatory Visit: Payer: Self-pay

## 2011-12-17 ENCOUNTER — Encounter: Payer: Self-pay | Admitting: Infectious Diseases

## 2011-12-17 ENCOUNTER — Telehealth: Payer: Self-pay | Admitting: *Deleted

## 2011-12-17 ENCOUNTER — Ambulatory Visit (INDEPENDENT_AMBULATORY_CARE_PROVIDER_SITE_OTHER): Payer: Medicaid Other | Admitting: Infectious Diseases

## 2011-12-17 VITALS — BP 126/76 | HR 91 | Temp 97.8°F | Wt 140.0 lb

## 2011-12-17 DIAGNOSIS — Z23 Encounter for immunization: Secondary | ICD-10-CM

## 2011-12-17 DIAGNOSIS — M549 Dorsalgia, unspecified: Secondary | ICD-10-CM

## 2011-12-17 DIAGNOSIS — B86 Scabies: Secondary | ICD-10-CM

## 2011-12-17 DIAGNOSIS — G8929 Other chronic pain: Secondary | ICD-10-CM

## 2011-12-17 DIAGNOSIS — B20 Human immunodeficiency virus [HIV] disease: Secondary | ICD-10-CM

## 2011-12-17 DIAGNOSIS — Z79899 Other long term (current) drug therapy: Secondary | ICD-10-CM

## 2011-12-17 DIAGNOSIS — Z113 Encounter for screening for infections with a predominantly sexual mode of transmission: Secondary | ICD-10-CM

## 2011-12-17 DIAGNOSIS — Z21 Asymptomatic human immunodeficiency virus [HIV] infection status: Secondary | ICD-10-CM

## 2011-12-17 HISTORY — DX: Scabies: B86

## 2011-12-17 MED ORDER — PERMETHRIN 5 % EX CREA: 1.0000 "application " | TOPICAL_CREAM | Freq: Once | CUTANEOUS | Status: DC

## 2011-12-17 MED ORDER — LINDANE 1 % EX LOTN: TOPICAL_LOTION | Freq: Once | CUTANEOUS | Status: DC

## 2011-12-17 MED ORDER — OXYCODONE-ACETAMINOPHEN 5-325 MG PO TABS: 1.0000 | ORAL_TABLET | Freq: Three times a day (TID) | ORAL | Status: DC | PRN

## 2011-12-17 NOTE — Assessment & Plan Note (Signed)
He is doing well. Watch for Cr changes with cobicistat. His #s are explained. He gets flu shot. recieved Hep A series but did not convert. Will see him back in 6 months with labs.

## 2011-12-17 NOTE — Telephone Encounter (Signed)
RN spoke w/ Dr. Ninetta Lights.  Changed rx to preferred rx through Medicaid.

## 2011-12-17 NOTE — Progress Notes (Signed)
  Subjective:    Patient ID: Richard Rojas, male    DOB: 16-Jun-1990, 21 y.o.   MRN: [id number]  HPI 21 yo M with hx of HIV+, syphillis, depression and scoliosis.  C/o back pain. Got percocet at his previous visit, has since run out and wants refill. Is doing Physical therapy. Denies paresthesias.  Rash on testicles. More like grey hair- doesn't itch. No sores or ulcers. No problems with with stribilid    HIV 1 RNA Quant (copies/mL)  Date Value  11/19/2011 <20   07/16/2011 25*  05/20/2011 6407*     CD4 T Cell Abs (cmm)  Date Value  11/19/2011 700   07/16/2011 770   05/19/2011 190*       Review of Systems  Constitutional: Negative for appetite change and unexpected weight change.  Gastrointestinal: Negative for diarrhea and constipation.  Genitourinary: Negative for dysuria.       Objective:   Physical Exam  Constitutional: He appears well-developed and well-nourished.  HENT:  Mouth/Throat: No oropharyngeal exudate.  Eyes: EOM are normal. Pupils are equal, round, and reactive to light.  Neck: Neck supple.  Cardiovascular: Normal rate, regular rhythm and normal heart sounds.   Pulmonary/Chest: Effort normal and breath sounds normal.  Abdominal: Soft. Bowel sounds are normal. There is no tenderness.  Lymphadenopathy:    He has no cervical adenopathy.  Skin: Purpura and rash noted.             Assessment & Plan:

## 2011-12-17 NOTE — Addendum Note (Signed)
Addended by: Aariah Godette C on: 12/17/2011 09:35 AM   Modules accepted: Orders

## 2011-12-17 NOTE — Assessment & Plan Note (Addendum)
Not clear of his pubic rash- does not itch, no erythema. He shaves area and it returns. Bathes daily. Will give him trial of lindane. Given instructions on washing clothes, linens in hot water

## 2011-12-17 NOTE — Assessment & Plan Note (Signed)
Will refill his percocet. Pain contract. Pain clinic eval.

## 2011-12-24 ENCOUNTER — Emergency Department (HOSPITAL_COMMUNITY)
Admission: EM | Admit: 2011-12-24 | Discharge: 2011-12-24 | Disposition: A | Discharge status: Home / Self Care | Payer: Medicaid Other | Attending: Emergency Medicine | Admitting: Emergency Medicine

## 2011-12-24 ENCOUNTER — Encounter (HOSPITAL_COMMUNITY): Payer: Self-pay | Admitting: Emergency Medicine

## 2011-12-24 DIAGNOSIS — M25519 Pain in unspecified shoulder: Secondary | ICD-10-CM | POA: Insufficient documentation

## 2011-12-24 DIAGNOSIS — R52 Pain, unspecified: Secondary | ICD-10-CM | POA: Insufficient documentation

## 2011-12-24 DIAGNOSIS — Z87891 Personal history of nicotine dependence: Secondary | ICD-10-CM | POA: Insufficient documentation

## 2011-12-24 MED ORDER — IBUPROFEN 800 MG PO TABS: 800.0000 mg | ORAL_TABLET | Freq: Three times a day (TID) | ORAL | Status: DC

## 2011-12-24 MED ORDER — CYCLOBENZAPRINE HCL 10 MG PO TABS: 10.0000 mg | ORAL_TABLET | Freq: Two times a day (BID) | ORAL | Status: DC | PRN

## 2011-12-24 NOTE — ED Notes (Signed)
Pt st's he's taking percocet at home that he his prescribed for back pain but that isn't helping the pain.
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
## 2011-12-25 NOTE — ED Provider Notes (Signed)
Medical screening examination/treatment/procedure(s) were performed by non-physician practitioner and as supervising physician I was immediately available for consultation/collaboration.  Vickie Ponds, MD 12/25/11 2018 

## 2012-01-15 ENCOUNTER — Ambulatory Visit: Payer: Self-pay

## 2012-01-15 ENCOUNTER — Ambulatory Visit: Payer: Medicaid Other

## 2012-01-15 ENCOUNTER — Encounter: Payer: Self-pay | Admitting: Sports Medicine

## 2012-01-15 ENCOUNTER — Ambulatory Visit (INDEPENDENT_AMBULATORY_CARE_PROVIDER_SITE_OTHER): Payer: Medicaid Other | Admitting: Sports Medicine

## 2012-01-15 VITALS — BP 132/95 | HR 93 | Temp 98.2°F | Ht 73.0 in | Wt 139.0 lb

## 2012-01-15 DIAGNOSIS — B20 Human immunodeficiency virus [HIV] disease: Secondary | ICD-10-CM

## 2012-01-15 DIAGNOSIS — F329 Major depressive disorder, single episode, unspecified: Secondary | ICD-10-CM

## 2012-01-15 DIAGNOSIS — L732 Hidradenitis suppurativa: Secondary | ICD-10-CM

## 2012-01-15 DIAGNOSIS — F410 Panic disorder [episodic paroxysmal anxiety] without agoraphobia: Secondary | ICD-10-CM

## 2012-01-15 DIAGNOSIS — M549 Dorsalgia, unspecified: Secondary | ICD-10-CM

## 2012-01-15 DIAGNOSIS — F3130 Bipolar disorder, current episode depressed, mild or moderate severity, unspecified: Secondary | ICD-10-CM

## 2012-01-15 DIAGNOSIS — F322 Major depressive disorder, single episode, severe without psychotic features: Secondary | ICD-10-CM

## 2012-01-15 DIAGNOSIS — G8929 Other chronic pain: Secondary | ICD-10-CM

## 2012-01-15 DIAGNOSIS — R45851 Suicidal ideations: Secondary | ICD-10-CM

## 2012-01-15 DIAGNOSIS — Z21 Asymptomatic human immunodeficiency virus [HIV] infection status: Secondary | ICD-10-CM

## 2012-01-15 HISTORY — DX: Hidradenitis suppurativa: L73.2

## 2012-01-15 MED ORDER — CYCLOBENZAPRINE HCL 10 MG PO TABS: 10.0000 mg | ORAL_TABLET | Freq: Two times a day (BID) | ORAL | Status: DC | PRN

## 2012-01-15 MED ORDER — MELOXICAM 15 MG PO TABS: 15.0000 mg | ORAL_TABLET | Freq: Every day | ORAL | Status: DC
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
## 2012-01-15 NOTE — Assessment & Plan Note (Signed)
Not currently suicidal but with recent history of ideation.  Will need to be followed closely.  Discussed safety and appropriateness of going to local ED if becomes danger to self

## 2012-01-15 NOTE — Assessment & Plan Note (Signed)
Followed By [facility].  Appreciate their care

## 2012-01-15 NOTE — Assessment & Plan Note (Signed)
Has been referred to pain clinic by ID. Will continue muscle relaxant. Pt needs intensive prolonged PT  And intensive home exercise regimen. Is applying for disability.  Happy to complete any paperwork provided. Pt does not want to follow up with NeuroSurg from [facility] as he "almost died" during initial surgery.  Refuses to consider repeat surgery - social work to help with resources for helping obtain intensive PT given medicaid restriction of 3 visits >follow up on Pain clinic eval

## 2012-01-15 NOTE — Patient Instructions (Addendum)
It was nice to see you today.  Please have your lawyer send a letter outlining what needs to be addressed in the letter.  They will need to send a release of information as well with this.  You should be hearing from Theresia Bough, our social worker to help coordinate things and to try to help figure out how to get you more assistance and hopefully more physical therapy.

## 2012-01-15 NOTE — Progress Notes (Signed)
  [facility]  Patient name: Richard Rojas MRN [id number]  Date of birth: [birth year]  CC & HPI:  Richard Rojas is a 21 y.o. male presenting today to me for the first time for follow up of:  # chronic back pain:  S/p harrington rod placement.  Has screw loose.  Never had PT following surgery.  Has completed 4 follow up visits but not currently in PT or doing home exercise regimen.  Reports applying for disability but has been denies.  Reports he cannot lift more than 5#s.  No bowel/bladder changes, no falls  # depression:  Recently hospitalized for SI.  Denies being actively SI but does report he has thought about it.   # HIV:  Compliant with ID follow up.    # smoking:  Current everyday.  Only 3-4 per day.  Does not feel ready to quit. ------------------------------------------------------------------------------------------------------------------ Medication Compliance: compliant most of the time  ------------------------------------------------------------------------------------------------------------------ New Concerns:  # Armpit knots: Has 2 knots in his R arm pit that came up 2 weeks ago. Have fluctuated in size.  No drainage.  Currently non painful.  Uses Deoderant but no shaving.   ROS:  No fevers, no chills,   Pertinent History Reviewed:  Medical & Surgical Hx:  Reviewed: Significant for HIV, syphilis, scoliosis Medications: Reviewed & Updated - see associated section Social History: Reviewed -  reports that he quit smoking about 2 months ago. His smoking use included Cigarettes. He has a .6 pack-year smoking history. He has never used smokeless tobacco.  Objective Findings:  Vitals:  Filed Vitals:   01/15/12 0908  BP: 132/95  Pulse: 93  Temp: 98.2 F (36.8 C)    PE: GENERAL:  Adult thin AA male. In no discomfort; no respiratory distress. PSYCH: Alert and appropriately interactive; Insight:Fair.  Denies active SI but does report he has thought about it  recently.  Feels safe to go home.  No immediate plans but does report he has thought about OD.   H&N: AT/Portal, trachea midline EENT:  MMM, no scleral icterus, EOMi HEART: RRR, S1/S2 heard, no murmur LUNGS: CTA B, no wheezes, no crackles EXTREMITIES: Moves all 4 extremities spontaneously, warm well perfused, no edema, bilateral DP and PT pulses 2/4.  2 1cm nodules on R underarm, anterior axillary fold.  Mobile, mildly tender without evidence of inflammation or infection.   BACK:  Incisional scar from T1-L5 well healed.  Left Lumbar rotation.      Assessment & Plan:

## 2012-01-15 NOTE — Assessment & Plan Note (Signed)
No evidence of currently inflamed or infected.  Apply warm compress.  Try antiperspirant although may exacerbate symptoms.  Consider I&D or injection if not improved with symptomatic treatment.  Will avoid trauma to the area. >

## 2012-01-15 NOTE — Assessment & Plan Note (Signed)
Had been previously hospitalized.  Reports compliance with medications.  No active SI/HI but does report occasionally having SI with loosely laid out plan.  Does not feel he is a threat to himself and he is currently in counseling.  Will continue to follow and provide support.   Social work to discuss ongoing issues

## 2012-01-22 ENCOUNTER — Ambulatory Visit: Payer: Self-pay

## 2012-01-26 NOTE — Progress Notes (Signed)
Clinical Child psychotherapist (CSW) received a referral. CSW observed that pt is being seen by a counselor at [facility] to address his mental health. Also, due to pt insurance there are limited amount of PT days his insurance will cover. Please re consult for additional CSW needs.  Theresia Bough, MSW, Theresia Majors [phone]

## 2012-01-29 ENCOUNTER — Ambulatory Visit: Payer: Medicaid Other

## 2012-01-29 DIAGNOSIS — F3160 Bipolar disorder, current episode mixed, unspecified: Secondary | ICD-10-CM

## 2012-01-29 NOTE — Progress Notes (Signed)
I met with Richard Rojas again today and he reported some improvement in his mood, which he thinks is due to the medication.  He asked questions about mood stabilizers and Bipolar Disorder.  He talked about how he gets angry over things, but not as strongly as he used to.  He tends to stay to himself and avoid people in general, he said.  I facilitated a guided relaxation exercise, combined with the "container" and "safe place" exercises.  He reported some benefit from this.  Plan to meet in one week.

## 2012-02-05 ENCOUNTER — Ambulatory Visit: Payer: Self-pay

## 2012-02-17 ENCOUNTER — Ambulatory Visit: Payer: Medicaid Other

## 2012-02-17 ENCOUNTER — Other Ambulatory Visit: Payer: Self-pay | Admitting: *Deleted

## 2012-02-17 ENCOUNTER — Other Ambulatory Visit: Payer: Medicaid Other

## 2012-02-17 DIAGNOSIS — G8929 Other chronic pain: Secondary | ICD-10-CM

## 2012-02-17 DIAGNOSIS — A539 Syphilis, unspecified: Secondary | ICD-10-CM

## 2012-02-17 DIAGNOSIS — F322 Major depressive disorder, single episode, severe without psychotic features: Secondary | ICD-10-CM

## 2012-02-17 DIAGNOSIS — B20 Human immunodeficiency virus [HIV] disease: Secondary | ICD-10-CM

## 2012-02-17 DIAGNOSIS — F3130 Bipolar disorder, current episode depressed, mild or moderate severity, unspecified: Secondary | ICD-10-CM

## 2012-02-17 DIAGNOSIS — M549 Dorsalgia, unspecified: Secondary | ICD-10-CM

## 2012-02-17 LAB — CBC WITH DIFFERENTIAL/PLATELET
Basophils Absolute: 0 10*3/uL (ref 0.0–0.1)
Basophils Relative: 0 % (ref 0–1)
Hemoglobin: 14.8 g/dL (ref 13.0–17.0)
MCHC: 34.6 g/dL (ref 30.0–36.0)
Monocytes Relative: 10 % (ref 3–12)
Neutro Abs: 3.7 10*3/uL (ref 1.7–7.7)
Neutrophils Relative %: 55 % (ref 43–77)
RDW: 13.4 % (ref 11.5–15.5)
WBC: 6.7 10*3/uL (ref 4.0–10.5)

## 2012-02-17 MED ORDER — ELVITEG-COBIC-EMTRICIT-TENOFDF 150-150-200-300 MG PO TABS: 1.0000 | ORAL_TABLET | Freq: Every day | ORAL | Status: DC
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
## 2012-02-18 ENCOUNTER — Other Ambulatory Visit: Payer: Self-pay

## 2012-02-18 LAB — LIPID PANEL
HDL: 39 mg/dL — ABNORMAL LOW (ref >39)
LDL Cholesterol: 85 mg/dL (ref 0–99)
Total CHOL/HDL Ratio: 3.7 Ratio

## 2012-02-18 LAB — T-HELPER CELL (CD4) - (RCID CLINIC ONLY): CD4 T Cell Abs: 800 uL (ref 400–2700)

## 2012-02-18 LAB — COMPLETE METABOLIC PANEL WITH GFR
AST: 15 U/L (ref 0–37)
Albumin: 4.6 g/dL (ref 3.5–5.2)
Alkaline Phosphatase: 103 U/L (ref 39–117)
Potassium: 4.6 mEq/L (ref 3.5–5.3)
Sodium: 142 mEq/L (ref 135–145)
Total Protein: 7.2 g/dL (ref 6.0–8.3)

## 2012-02-18 NOTE — Telephone Encounter (Signed)
Rx printed to be sent with ADAP application.

## 2012-02-19 ENCOUNTER — Ambulatory Visit: Payer: Self-pay

## 2012-02-19 LAB — HIV-1 RNA QUANT-NO REFLEX-BLD
HIV 1 RNA Quant: <20 copies/mL (ref <20)
HIV-1 RNA Quant, Log: <1.3 {Log} (ref <1.30)

## 2012-02-24 ENCOUNTER — Ambulatory Visit: Payer: Medicaid Other

## 2012-02-24 DIAGNOSIS — F4310 Post-traumatic stress disorder, unspecified: Secondary | ICD-10-CM

## 2012-02-24 DIAGNOSIS — F331 Major depressive disorder, recurrent, moderate: Secondary | ICD-10-CM
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
## 2012-03-01 ENCOUNTER — Other Ambulatory Visit: Payer: Self-pay | Admitting: *Deleted

## 2012-03-01 DIAGNOSIS — B20 Human immunodeficiency virus [HIV] disease: Secondary | ICD-10-CM

## 2012-03-01 MED ORDER — ELVITEG-COBIC-EMTRICIT-TENOFDF 150-150-200-300 MG PO TABS: 1.0000 | ORAL_TABLET | Freq: Every day | ORAL | Status: DC

## 2012-03-03 ENCOUNTER — Ambulatory Visit: Payer: Self-pay | Admitting: Infectious Disease

## 2012-03-04 ENCOUNTER — Ambulatory Visit: Payer: Medicaid Other

## 2012-03-04 DIAGNOSIS — F3160 Bipolar disorder, current episode mixed, unspecified: Secondary | ICD-10-CM

## 2012-03-04 NOTE — Progress Notes (Signed)
Richard Rojas reported that he is avoiding being around his mother as much as he can to avoid confrontations that could escalate.  He said that his medications have helped him to tolerate being around people more than he used to.  Today he began talking about his interest in starting a business someday.  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  We talked about ways to work toward this goal.  He thought he had to have a business degree to open a business.  I did not discourage him from going to school, but clarified that he does not have to have a degree to open a business.  Plan to meet again in 2 weeks.

## 2012-03-23 ENCOUNTER — Ambulatory Visit: Payer: Medicaid Other

## 2012-03-23 ENCOUNTER — Telehealth: Payer: Self-pay | Admitting: *Deleted

## 2012-03-23 ENCOUNTER — Encounter: Payer: Self-pay | Admitting: Sports Medicine

## 2012-03-23 ENCOUNTER — Ambulatory Visit (INDEPENDENT_AMBULATORY_CARE_PROVIDER_SITE_OTHER): Payer: Medicaid Other | Admitting: Infectious Disease

## 2012-03-23 ENCOUNTER — Ambulatory Visit (HOSPITAL_COMMUNITY)
Admission: RE | Admit: 2012-03-23 | Discharge: 2012-03-23 | Disposition: A | Discharge status: Home / Self Care | Payer: Medicaid Other | Source: Ambulatory Visit | Attending: Family Medicine | Admitting: Family Medicine

## 2012-03-23 ENCOUNTER — Ambulatory Visit (INDEPENDENT_AMBULATORY_CARE_PROVIDER_SITE_OTHER): Payer: Medicaid Other | Admitting: Sports Medicine

## 2012-03-23 ENCOUNTER — Encounter: Payer: Self-pay | Admitting: Infectious Disease

## 2012-03-23 VITALS — BP 117/69 | HR 90 | Temp 98.1°F | Wt 145.0 lb

## 2012-03-23 VITALS — BP 109/63 | HR 68 | Temp 98.1°F | Ht 73.0 in | Wt 140.6 lb

## 2012-03-23 DIAGNOSIS — S6000XA Contusion of unspecified finger without damage to nail, initial encounter: Secondary | ICD-10-CM

## 2012-03-23 DIAGNOSIS — X58XXXA Exposure to other specified factors, initial encounter: Secondary | ICD-10-CM

## 2012-03-23 DIAGNOSIS — B20 Human immunodeficiency virus [HIV] disease: Secondary | ICD-10-CM

## 2012-03-23 DIAGNOSIS — M549 Dorsalgia, unspecified: Secondary | ICD-10-CM

## 2012-03-23 DIAGNOSIS — R358 Other polyuria: Secondary | ICD-10-CM

## 2012-03-23 DIAGNOSIS — S6010XA Contusion of unspecified finger with damage to nail, initial encounter: Secondary | ICD-10-CM

## 2012-03-23 DIAGNOSIS — G8929 Other chronic pain: Secondary | ICD-10-CM

## 2012-03-23 DIAGNOSIS — F319 Bipolar disorder, unspecified: Secondary | ICD-10-CM

## 2012-03-23 DIAGNOSIS — R234 Changes in skin texture: Secondary | ICD-10-CM

## 2012-03-23 DIAGNOSIS — N509 Disorder of male genital organs, unspecified: Secondary | ICD-10-CM

## 2012-03-23 DIAGNOSIS — L988 Other specified disorders of the skin and subcutaneous tissue: Secondary | ICD-10-CM

## 2012-03-23 DIAGNOSIS — Z21 Asymptomatic human immunodeficiency virus [HIV] infection status: Secondary | ICD-10-CM

## 2012-03-23 HISTORY — DX: Contusion of unspecified finger with damage to nail, initial encounter: S60.10XA

## 2012-03-23 LAB — CBC WITH DIFFERENTIAL/PLATELET
Basophils Relative: 1 % (ref 0–1)
Eosinophils Absolute: 0.1 10*3/uL (ref 0.0–0.7)
Eosinophils Relative: 3 % (ref 0–5)
Lymphs Abs: 1.9 10*3/uL (ref 0.7–4.0)
MCH: 31.1 pg (ref 26.0–34.0)
MCHC: 34.4 g/dL (ref 30.0–36.0)
MCV: 90.5 fL (ref 78.0–100.0)
Neutrophils Relative %: 39 % — ABNORMAL LOW (ref 43–77)
Platelets: 203 10*3/uL (ref 150–400)
RDW: 14 % (ref 11.5–15.5)

## 2012-03-23 LAB — TESTOSTERONE: Testosterone: 612.07 ng/dL (ref 300–890)

## 2012-03-23 LAB — HEMOGLOBIN A1C: Hgb A1c MFr Bld: 5.8 % — ABNORMAL HIGH (ref <5.7)

## 2012-03-23 LAB — BASIC METABOLIC PANEL WITH GFR
BUN: 12 mg/dL (ref 6–23)
CO2: 28 mEq/L (ref 19–32)
Calcium: 9.6 mg/dL (ref 8.4–10.5)
Creat: 0.81 mg/dL (ref 0.50–1.35)
GFR, Est African American: >89 mL/min
Glucose, Bld: 84 mg/dL (ref 70–99)

## 2012-03-23 LAB — PROTIME-INR: Prothrombin Time: 12.8 seconds (ref 11.6–15.2)

## 2012-03-23 MED ORDER — BACLOFEN 10 MG PO TABS: 10.0000 mg | ORAL_TABLET | Freq: Three times a day (TID) | ORAL | Status: DC

## 2012-03-23 MED ORDER — OXYCODONE-ACETAMINOPHEN 5-325 MG PO TABS: 1.0000 | ORAL_TABLET | Freq: Three times a day (TID) | ORAL | Status: DC | PRN

## 2012-03-23 NOTE — Telephone Encounter (Signed)
PA required for Baclofen. Form given to Dr. Berline Chough.

## 2012-03-23 NOTE — Progress Notes (Signed)
Subjective:    Patient ID: Richard Rojas, male    DOB: 31-Oct-1990, 22 y.o.   MRN: [id number]  HPI  Richard Rojas is a 22 y.o. male who is doing superbly well on his  antiviral regimen, STRIBILD with undetectable viral load and health cd4 count. He is having trouble still with bipolar d/o and seeing Franne Forts our counselor. He had seenFP earlier in the day for workup of his LBP. He had seen Dr. Ninetta Lights in Oct due to concerns about pathology near his testicles. He claims today that the lindane did not help--though he did not apply to his full body. He states main issue is hair turing gray on scrotum.  He had several other new concerns: he was concerned he might be becoming diabetic due to recent increased thirst and at times increased urination.  He is also concerned about darkening areas under his nails on both hands where he has developed bleeding into the nail beds themselves. This is effecting ALL of his fingerips. He has been rx  meloxican and also at times ibuprofen I discovered though he denied NSAId use to me when I saw him. This has been going on for one months time. There is no other bleeding from other sites other than occ rectal blood with constipation--likely from constipation/hemorrhoids.  I spent greater than 45 minutes with the patient including greater than 50% of time in face to face counsel of the patient and in coordination of their care.    Review of Systems  Constitutional: Negative for fever, chills, diaphoresis, activity change, appetite change, fatigue and unexpected weight change.  HENT: Negative for congestion, sore throat, rhinorrhea, sneezing, trouble swallowing and sinus pressure.   Eyes: Negative for photophobia and visual disturbance.  Respiratory: Negative for cough, chest tightness, shortness of breath, wheezing and stridor.   Cardiovascular: Negative for chest pain, palpitations and leg swelling.  Gastrointestinal: Negative for nausea, vomiting, abdominal  pain, diarrhea, constipation, blood in stool, abdominal distention and anal bleeding.  Genitourinary: Negative for dysuria, hematuria, flank pain and difficulty urinating.  Musculoskeletal: Negative for myalgias, back pain, joint swelling, arthralgias and gait problem.  Skin: Positive for color change. Negative for pallor, rash and wound.  Neurological: Negative for dizziness, tremors, weakness and light-headedness.  Hematological: Negative for adenopathy. Bruises/bleeds easily.  Psychiatric/Behavioral: Positive for dysphoric mood. Negative for behavioral problems, confusion, sleep disturbance, decreased concentration and agitation.       Objective:   Physical Exam  Constitutional: He is oriented to person, place, and time. He appears well-developed and well-nourished. No distress.  HENT:  Head: Normocephalic and atraumatic.  Mouth/Throat: Oropharynx is clear and moist. No oropharyngeal exudate.  Eyes: Conjunctivae normal and EOM are normal. Pupils are equal, round, and reactive to light. No scleral icterus.  Neck: Normal range of motion. Neck supple. No JVD present.  Cardiovascular: Normal rate, regular rhythm and normal heart sounds.  Exam reveals no gallop and no friction rub.   No murmur heard. Pulmonary/Chest: Effort normal and breath sounds normal. No respiratory distress. He has no wheezes. He has no rales. He exhibits no tenderness.  Abdominal: He exhibits no distension and no mass. There is no tenderness. There is no rebound and no guarding.  Genitourinary:     Musculoskeletal: He exhibits no edema and no tenderness.       Arms: Lymphadenopathy:    He has no cervical adenopathy.  Neurological: He is alert and oriented to person, place, and time. He has normal reflexes. He  exhibits normal muscle tone. Coordination normal.  Skin: Skin is warm and dry. He is not diaphoretic. No erythema. No pallor.  Psychiatric: His behavior is normal. Judgment and thought content normal. His  mood appears anxious.          Assessment & Plan:   HIV: excellent control continue STRIBILD  Hair color change on scrotum/; asked him to try clotrimazole to this area  Polyuria, thirst; check hg a1c as   Subungual hematoma: check labs today PT/PTT cbc. I will ask that he stop ALL NSAIDS. If this does not resolve then he needs further evaluation by Dermatology and or Hematology  Biploar/; following with Bernette Redbird as well

## 2012-03-23 NOTE — Progress Notes (Unsigned)
Richard Rojas was pleasant today and reported that overall things were going "about the same".  He said things between his mother and he had calmed down lately.

## 2012-03-23 NOTE — Assessment & Plan Note (Addendum)
Pt not appropriate for manipulation at this time; no neurologic symptoms present Acute on Chronic Lumbar and R mid thoracic/rib due to fall on 03/22/2011 while getting out of bathtub Plane Film Series of thoracic and lumbar Refer to OrthoPedics for followup Provided pain medicine and baclofen as flexeril was not working

## 2012-03-23 NOTE — Patient Instructions (Addendum)
It was nice to see you today.   Today we discussed: 1. Chronic back pain  We are referring you to orthopedics at [facility].  You should hear from them  Please go get X-Rays today for your back  Take the following meds   - oxyCODONE-acetaminophen (PERCOCET/ROXICET) 5-325 MG per tablet; Take 1 tablet by mouth every 8 (eight) hours as needed for pain.  Dispense: 30 tablet; Refill: 0  - baclofen (LIORESAL) 10 MG tablet; Take 1 tablet (10 mg total) by mouth 3 (three) times daily.  Dispense: 30 each; Refill: 0  - DG Thoracic Spine 4V; Future  - DG Lumbar Spine Complete; Future  - Ambulatory referral to Orthopedic Surgery   Please plan to return to see me in 4 weeks.  If you need anything prior to seeing me please call the clinic.  Please Bring all medications with you to each appointment.

## 2012-03-23 NOTE — Telephone Encounter (Signed)
Form completed This is a preferred drug on Medicaid . Marland Kitchen Marland Kitchen

## 2012-03-24 LAB — VITAMIN D 25 HYDROXY (VIT D DEFICIENCY, FRACTURES): Vit D, 25-Hydroxy: 15 ng/mL — ABNORMAL LOW (ref 30–89)

## 2012-03-24 NOTE — Progress Notes (Signed)
  [facility]  Patient name: Richard Rojas MRN [id number]  Date of birth: 12-Jul-1990  CC & HPI:  Richard Rojas is a 22 y.o. male presenting today for acute worsening of chronic low back and mid thoracic pain.  Reports he fell while getting out of the shower 2 days prior.  Tripped on the shower curtain and fell on back and side.  Has been taking flexeril and pain medicine without significant relief.  Feels that his surgical hardware as worsened and that his "loose screw" is significantly bothering him.  Reports he has not follow up with Orthopedics since his surgery.  ROS:   Constitutional No fevers.  Present but stable malaise, faigue depression     Resp No cough, no dyspnea, no hemoptysis  Cardiac   GI No changes in bowel function, regular BM q 2-3 days with occasional straining and blood streaked stool from hemorrhoids  GU No changes in bladder function, no hesistancy, or incontinence  Skin   Psych Followed by ID psychologist for his BiPolar disorder as well as by [facility] [place].  Doing better with current counseling and medications as per med list (updated)  Neuro No radicular symptoms, weakness, no numbness, no tingling  MEDS Reports good compliance     Pertinent History Reviewed:  Medical & Surgical Hx:  Reviewed: Significant for HIV followed by ID.  Medications: Reviewed & Updated - see associated section Social History: Reviewed -  reports that he has been smoking Cigarettes.  He has a .6 pack-year smoking history. He has never used smokeless tobacco.  Objective Findings:  Vitals:  Filed Vitals:   03/23/12 1342  BP: 109/63  Pulse: 68  Temp: 98.1 F (36.7 C)    PE: GENERAL:  Adult thin AA male. In no discomfort; no respiratory distress. PSYCH: Alert and appropriately interactive; Insight:Fair to Lacking  H&N: AT/Richard Rojas, trachea midline EENT:  MMM, no scleral icterus, EOMi HEART: RRR, S1/S2 heard, no murmur LUNGS: CTA B, no wheezes, no  crackles EXTREMITIES: Moves all 4 extremities spontaneously, warm well perfused, no edema, bilateral DP and PT pulses 2/4.   NEUROMSK: L lumbar prominence that is TTP, R mid thoracic prominence with posterior R rib 6 that is TTP.  strength 5+/5 in UE & LE myotomes, reflexes 2+/4 in UE and LE.  Sensation grossly intact, straight leg raise negative.  Muscle spasms diffusely through lumbar and thoracic regions.. Noted significant cervico-thoracic kyphosis    Assessment & Plan:

## 2012-03-25 LAB — HIV-1 RNA QUANT-NO REFLEX-BLD
HIV 1 RNA Quant: <20 copies/mL (ref <20)
HIV-1 RNA Quant, Log: <1.3 {Log} (ref <1.30)

## 2012-03-25 LAB — T-HELPER CELL (CD4) - (RCID CLINIC ONLY): CD4 % Helper T Cell: 45 % (ref 33–55)

## 2012-03-30 ENCOUNTER — Ambulatory Visit: Payer: Self-pay

## 2012-03-30 ENCOUNTER — Encounter: Payer: Self-pay | Admitting: Infectious Diseases

## 2012-03-30 DIAGNOSIS — Z79899 Other long term (current) drug therapy: Secondary | ICD-10-CM

## 2012-03-31 ENCOUNTER — Telehealth: Payer: Self-pay | Admitting: Infectious Disease

## 2012-03-31 DIAGNOSIS — E559 Vitamin D deficiency, unspecified: Secondary | ICD-10-CM | POA: Insufficient documentation

## 2012-03-31 MED ORDER — CALCIUM CARBONATE-VITAMIN D 500-400 MG-UNIT PO TABS: 2.0000 | ORAL_TABLET | Freq: Every day | ORAL | Status: DC

## 2012-03-31 MED ORDER — CHOLECALCIFEROL 1.25 MG (50000 UT) PO CAPS: 50000.0000 [IU] | ORAL_CAPSULE | ORAL | Status: DC

## 2012-03-31 NOTE — Telephone Encounter (Signed)
I was unable to reach the patient, both numbers on his chart did know this patient. We can send a letter to the patient if you like.

## 2012-03-31 NOTE — Telephone Encounter (Signed)
Lets do that outlining how to take his vitamin d

## 2012-03-31 NOTE — Telephone Encounter (Signed)
Richard Rojas, Richard Rojas also is vitamin D deficiency.  HE also needs to take the high dose formuatoin for 8 weeks then daily calcium and vitamin d  He should not take these vitamins and calcium WITH his stribild but at different time in the day

## 2012-04-15 ENCOUNTER — Ambulatory Visit: Payer: Self-pay

## 2012-05-13 ENCOUNTER — Ambulatory Visit: Payer: Self-pay

## 2012-06-03 ENCOUNTER — Ambulatory Visit: Payer: Self-pay

## 2012-06-15 ENCOUNTER — Ambulatory Visit: Payer: Self-pay

## 2012-06-29 ENCOUNTER — Ambulatory Visit: Payer: Self-pay

## 2012-07-21 ENCOUNTER — Other Ambulatory Visit: Payer: Self-pay

## 2012-07-21 ENCOUNTER — Ambulatory Visit: Payer: Self-pay

## 2012-07-22 ENCOUNTER — Ambulatory Visit: Payer: Self-pay

## 2012-07-22 ENCOUNTER — Other Ambulatory Visit: Payer: Self-pay

## 2012-08-04 ENCOUNTER — Ambulatory Visit: Payer: Self-pay | Admitting: Infectious Disease

## 2012-08-04 NOTE — Progress Notes (Signed)
Patient ID: Richard Rojas, male   DOB: 03-Sep-1990, 22 y.o.   MRN: [id number] [facility] CM: [name]

## 2012-08-05 ENCOUNTER — Other Ambulatory Visit: Payer: Medicaid Other

## 2012-08-05 DIAGNOSIS — B20 Human immunodeficiency virus [HIV] disease: Secondary | ICD-10-CM

## 2012-08-05 DIAGNOSIS — R234 Changes in skin texture: Secondary | ICD-10-CM

## 2012-08-05 DIAGNOSIS — S6010XA Contusion of unspecified finger with damage to nail, initial encounter: Secondary | ICD-10-CM

## 2012-08-05 DIAGNOSIS — N509 Disorder of male genital organs, unspecified: Secondary | ICD-10-CM

## 2012-08-05 DIAGNOSIS — F319 Bipolar disorder, unspecified: Secondary | ICD-10-CM

## 2012-08-05 LAB — LIPID PANEL
HDL: 34 mg/dL — ABNORMAL LOW (ref >39)
Total CHOL/HDL Ratio: 4.2 Ratio
VLDL: 22 mg/dL (ref 0–40)

## 2012-08-05 LAB — CBC WITH DIFFERENTIAL/PLATELET
Basophils Absolute: 0 10*3/uL (ref 0.0–0.1)
Eosinophils Relative: 4 % (ref 0–5)
Lymphocytes Relative: 46 % (ref 12–46)
Lymphs Abs: 2.2 10*3/uL (ref 0.7–4.0)
MCV: 93.9 fL (ref 78.0–100.0)
Neutro Abs: 1.9 10*3/uL (ref 1.7–7.7)
Neutrophils Relative %: 41 % — ABNORMAL LOW (ref 43–77)
Platelets: 199 10*3/uL (ref 150–400)
RBC: 4.59 MIL/uL (ref 4.22–5.81)
RDW: 13.8 % (ref 11.5–15.5)
WBC: 4.7 10*3/uL (ref 4.0–10.5)

## 2012-08-05 LAB — COMPLETE METABOLIC PANEL WITH GFR
ALT: <8 U/L (ref 0–53)
AST: 15 U/L (ref 0–37)
Albumin: 4.3 g/dL (ref 3.5–5.2)
Alkaline Phosphatase: 93 U/L (ref 39–117)
Potassium: 4.1 mEq/L (ref 3.5–5.3)
Sodium: 139 mEq/L (ref 135–145)
Total Bilirubin: 0.7 mg/dL (ref 0.3–1.2)
Total Protein: 6.7 g/dL (ref 6.0–8.3)

## 2012-08-06 LAB — HIV-1 RNA QUANT-NO REFLEX-BLD
HIV 1 RNA Quant: <20 copies/mL (ref <20)
HIV-1 RNA Quant, Log: <1.3 {Log} (ref <1.30)

## 2012-08-06 LAB — T-HELPER CELL (CD4) - (RCID CLINIC ONLY)
CD4 % Helper T Cell: 40 % (ref 33–55)
CD4 T Cell Abs: 890 uL (ref 400–2700)

## 2012-08-16 ENCOUNTER — Ambulatory Visit: Payer: Self-pay | Admitting: Infectious Disease

## 2012-09-20 ENCOUNTER — Other Ambulatory Visit: Payer: Self-pay | Admitting: *Deleted

## 2012-09-20 DIAGNOSIS — B20 Human immunodeficiency virus [HIV] disease: Secondary | ICD-10-CM

## 2012-09-20 MED ORDER — ELVITEG-COBIC-EMTRICIT-TENOFDF 150-150-200-300 MG PO TABS: 1.0000 | ORAL_TABLET | Freq: Every day | ORAL | Status: DC

## 2012-09-20 NOTE — Telephone Encounter (Signed)
Pt needs follow-up MD/lab appts.  Left [facility] telephone number.

## 2012-09-23 ENCOUNTER — Ambulatory Visit: Payer: Self-pay | Admitting: Infectious Disease

## 2012-10-11 ENCOUNTER — Ambulatory Visit (INDEPENDENT_AMBULATORY_CARE_PROVIDER_SITE_OTHER): Payer: Medicaid Other | Admitting: Internal Medicine

## 2012-10-11 ENCOUNTER — Encounter: Payer: Self-pay | Admitting: Internal Medicine

## 2012-10-11 VITALS — BP 114/66 | HR 85 | Temp 97.7°F | Ht 73.0 in | Wt 139.0 lb

## 2012-10-11 DIAGNOSIS — L0292 Furuncle, unspecified: Secondary | ICD-10-CM

## 2012-10-11 DIAGNOSIS — B369 Superficial mycosis, unspecified: Secondary | ICD-10-CM

## 2012-10-11 DIAGNOSIS — K629 Disease of anus and rectum, unspecified: Secondary | ICD-10-CM | POA: Insufficient documentation

## 2012-10-11 DIAGNOSIS — B354 Tinea corporis: Secondary | ICD-10-CM

## 2012-10-11 HISTORY — DX: Furuncle, unspecified: L02.92

## 2012-10-11 MED ORDER — FLUCONAZOLE 100 MG PO TABS: 200.0000 mg | ORAL_TABLET | Freq: Every day | ORAL | Status: DC

## 2012-10-11 MED ORDER — SULFAMETHOXAZOLE-TRIMETHOPRIM 800-160 MG PO TABS: 1.0000 | ORAL_TABLET | Freq: Two times a day (BID) | ORAL | Status: DC

## 2012-10-11 NOTE — Assessment & Plan Note (Signed)
I am not sure of what is causing his pink area perianally and pubic hair.  I will try fluconazole.

## 2012-10-11 NOTE — Progress Notes (Signed)
  Subjective:    Patient ID: Richard Rojas, male    DOB: [birth year], 22 y.o.   MRN: [id number]  HPI He comes in for a work in visit.  His complaints are discoloration of his pubic hair, a boil on his leg and itchy perianal area.  For his boil it is painful and draining pus.  No recent bite.  No fever or chills.     Review of Systems  Constitutional: Negative for fever and fatigue.  Eyes: Negative for visual disturbance.  Respiratory: Negative for shortness of breath.   Gastrointestinal: Negative for nausea and diarrhea.  Musculoskeletal: Negative for myalgias and arthralgias.  Skin: Positive for rash and wound.  Neurological: Negative for dizziness, light-headedness and headaches.  Hematological: Negative for adenopathy.  Psychiatric/Behavioral: Negative for dysphoric mood.       Objective:   Physical Exam  Constitutional: He appears well-developed and well-nourished. No distress.  Genitourinary:  Perianal area with pink 1/2 cm tissue, itchy  Pubic hair with coating on hair  Skin:  Right thigh with induration, some pus, firm, painful          Assessment & Plan:

## 2012-10-11 NOTE — Assessment & Plan Note (Addendum)
I will give him Bactrim.  He is going to call his PCP to consider draining with I and D it if it does not continue to drain on its own.

## 2012-11-13 ENCOUNTER — Encounter (HOSPITAL_COMMUNITY): Payer: Self-pay | Admitting: *Deleted

## 2012-11-13 ENCOUNTER — Emergency Department (HOSPITAL_COMMUNITY)
Admission: EM | Admit: 2012-11-13 | Discharge: 2012-11-13 | Disposition: A | Discharge status: Home / Self Care | Payer: Medicaid Other | Attending: Emergency Medicine | Admitting: Emergency Medicine

## 2012-11-13 DIAGNOSIS — Z79899 Other long term (current) drug therapy: Secondary | ICD-10-CM | POA: Insufficient documentation

## 2012-11-13 DIAGNOSIS — F17200 Nicotine dependence, unspecified, uncomplicated: Secondary | ICD-10-CM | POA: Insufficient documentation

## 2012-11-13 DIAGNOSIS — M545 Low back pain, unspecified: Secondary | ICD-10-CM | POA: Insufficient documentation

## 2012-11-13 DIAGNOSIS — N342 Other urethritis: Secondary | ICD-10-CM

## 2012-11-13 DIAGNOSIS — R21 Rash and other nonspecific skin eruption: Secondary | ICD-10-CM | POA: Insufficient documentation

## 2012-11-13 DIAGNOSIS — K6289 Other specified diseases of anus and rectum: Secondary | ICD-10-CM | POA: Insufficient documentation

## 2012-11-13 DIAGNOSIS — Z21 Asymptomatic human immunodeficiency virus [HIV] infection status: Secondary | ICD-10-CM | POA: Insufficient documentation

## 2012-11-13 DIAGNOSIS — M4120 Other idiopathic scoliosis, site unspecified: Secondary | ICD-10-CM | POA: Insufficient documentation

## 2012-11-13 HISTORY — DX: Gonococcal infection, unspecified: A54.9

## 2012-11-13 LAB — URINE MICROSCOPIC-ADD ON

## 2012-11-13 LAB — URINALYSIS, ROUTINE W REFLEX MICROSCOPIC
Hgb urine dipstick: NEGATIVE
Nitrite: NEGATIVE
Protein, ur: 30 mg/dL — AB
Urobilinogen, UA: 1 mg/dL (ref 0.0–1.0)

## 2012-11-13 MED ORDER — LIDOCAINE HCL 1 % IJ SOLN
INTRAMUSCULAR | Status: AC
  Administered 2012-11-13: 20 mL
  Filled 2012-11-13: qty 20

## 2012-11-13 MED ORDER — CEFTRIAXONE SODIUM 250 MG IJ SOLR
250.0000 mg | Freq: Once | INTRAMUSCULAR | Status: AC
  Administered 2012-11-13: 250 mg via INTRAMUSCULAR
  Filled 2012-11-13: qty 250

## 2012-11-13 MED ORDER — AZITHROMYCIN 250 MG PO TABS
1000.0000 mg | ORAL_TABLET | Freq: Once | ORAL | Status: AC
  Administered 2012-11-13: 1000 mg via ORAL
  Filled 2012-11-13: qty 4

## 2012-11-13 NOTE — ED Provider Notes (Signed)
CSN: [id number]     Arrival date & time 11/13/12  0159 History   First MD Initiated Contact with Patient 11/13/12 [phone]     Chief Complaint  Patient presents with  . Back Pain   (Consider location/radiation/quality/duration/timing/severity/associated sxs/prior Treatment) HPI Comments: History history of HIV recently seen by his infectious disease doctor and treated with Diflucan for suspected fungal infection in the area patient states the area is persistently itchy and discolored and painful he also reports dysuria and low back pain. He does have a patch of discolored pubic hair which was noted on his last office visit they are unsure as to the cause but are watching.  Small abscess he had in his left eye has resolved with the use of Septra He also reports that he has had intermittent penile discharge he states that he has used a condom each and every time he has had intercourse. He does have anal intercourse  Patient is a 22 y.o. male presenting with back pain. The history is provided by the patient.  Back Pain Location:  Lumbar spine Quality:  Aching Radiates to:  Does not radiate Pain severity:  Mild Chronicity:  New Relieved by:  None tried Worsened by:  Nothing tried Ineffective treatments:  None tried Associated symptoms: dysuria   Associated symptoms: no abdominal pain, no bowel incontinence, no fever, no leg pain, no pelvic pain and no perianal numbness     Past Medical History  Diagnosis Date  . Scoliosis   . Syphilis 2012, june    not clear he completed treatment.  non-compliant with follow up.  . Syphilis 08/09/2010  . HIV (human immunodeficiency virus infection)   . Gonorrhea in male    Past Surgical History  Procedure Laterality Date  . Spine surgery      harrington rod placement for scoliosis  . Back surgery     History reviewed. No pertinent family history. History  Substance Use Topics  . Smoking status: Current Some Day Smoker -- 0.30 packs/day for 2 years     Types: Cigarettes    Last Attempt to Quit: 11/12/2011  . Smokeless tobacco: Never Used  . Alcohol Use: No    Review of Systems  Constitutional: Negative for fever.  HENT: Negative for congestion.   Respiratory: Negative for shortness of breath.   Gastrointestinal: Negative for nausea, abdominal pain, diarrhea, constipation and bowel incontinence.  Genitourinary: Positive for dysuria. Negative for discharge, scrotal swelling, testicular pain and pelvic pain.  Musculoskeletal: Positive for back pain.  Skin: Positive for rash.  Neurological: Negative for dizziness and light-headedness.  All other systems reviewed and are negative.    Allergies  Review of patient's allergies indicates no known allergies.  Home Medications   Current Outpatient Rx  Name  Route  Sig  Dispense  Refill  . divalproex (DEPAKOTE) 125 MG DR tablet   Oral   Take 125 mg by mouth 3 (three) times daily.         Marland Kitchen elvitegravir-cobicistat-emtricitabine-tenofovir (STRIBILD) 150-150-200-300 MG TABS   Oral   Take 1 tablet by mouth daily with breakfast.   30 tablet   5     Pt needs follow-up lab work and MD appts.  Please  ...   . pantoprazole (PROTONIX) 40 MG tablet   Oral   Take 40 mg by mouth every morning.         Marland Kitchen RisperiDONE (RISPERDAL PO)   Oral   Take 1 tablet by mouth daily.  BP 109/57  Pulse 88  Temp(Src) 99.1 F (37.3 C) (Oral)  Resp 15  Ht 6\' 1"  (1.854 m)  Wt 140 lb (63.504 kg)  BMI 18.47 kg/m2  SpO2 98% Physical Exam  Constitutional: He is oriented to person, place, and time. He appears well-developed and well-nourished.  HENT:  Head: Normocephalic and atraumatic.  Eyes: Pupils are equal, round, and reactive to light.  Neck: Normal range of motion.  Cardiovascular: Normal rate and regular rhythm.   Pulmonary/Chest: Effort normal and breath sounds normal.  Genitourinary: Rectum normal.  Patient has several lesions surrounding the anus slightly raised pink  patient's states they are tender there is no drainage he has 2 lesions at the 10:00 location look like small abrasions they are 1-2 mm apiece without drainage  Musculoskeletal: Normal range of motion.  Neurological: He is alert and oriented to person, place, and time.  Skin: Skin is warm.    ED Course  Procedures (including critical care time) Labs Review Labs Reviewed  URINALYSIS, ROUTINE W REFLEX MICROSCOPIC - Abnormal; Notable for the following:    Color, Urine AMBER (*)    Specific Gravity, Urine 1.041 (*)    Protein, ur 30 (*)    Leukocytes, UA SMALL (*)    All other components within normal limits  GC/CHLAMYDIA PROBE AMP  URINE MICROSCOPIC-ADD ON   Imaging Review No results found.  MDM   1. Urethritis   2. Anal irritation    urine shows many white cells without any sign of bacteria due to this patient's penile discharge it's likely that he has an STD he will be treated with Rocephin and azithromycin and instructed to follow up with his infectious disease doctor this week      Arman Filter, NP 11/13/12 [phone]

## 2012-11-13 NOTE — ED Notes (Signed)
Pt admits to lower back pain, tingling radiating down his lower extremities and genitals - pt states he has a "bleach spot" on his rectal area, as well as discoloration to his pubic hair. Pt admits to intermittent penile discharge and has been sexually active.

## 2012-11-13 NOTE — ED Provider Notes (Signed)
Medical screening examination/treatment/procedure(s) were performed by non-physician practitioner and as supervising physician I was immediately available for consultation/collaboration.  Brynnly Bonet M Anabell Swint, MD 11/13/12 0617 

## 2012-11-15 LAB — GC/CHLAMYDIA PROBE AMP: GC Probe RNA: NEGATIVE

## 2012-11-24 ENCOUNTER — Ambulatory Visit: Payer: Self-pay | Admitting: Family Medicine

## 2012-11-26 ENCOUNTER — Ambulatory Visit: Payer: Self-pay | Admitting: Sports Medicine

## 2012-12-20 ENCOUNTER — Ambulatory Visit (INDEPENDENT_AMBULATORY_CARE_PROVIDER_SITE_OTHER): Payer: Medicaid Other | Admitting: Sports Medicine

## 2012-12-20 ENCOUNTER — Encounter: Payer: Self-pay | Admitting: Sports Medicine

## 2012-12-20 VITALS — BP 121/65 | HR 85 | Temp 98.2°F | Ht 73.0 in | Wt 137.8 lb

## 2012-12-20 DIAGNOSIS — K6289 Other specified diseases of anus and rectum: Secondary | ICD-10-CM

## 2012-12-20 DIAGNOSIS — M549 Dorsalgia, unspecified: Secondary | ICD-10-CM

## 2012-12-20 DIAGNOSIS — K648 Other hemorrhoids: Secondary | ICD-10-CM

## 2012-12-20 DIAGNOSIS — M4120 Other idiopathic scoliosis, site unspecified: Secondary | ICD-10-CM

## 2012-12-20 DIAGNOSIS — G8929 Other chronic pain: Secondary | ICD-10-CM

## 2012-12-20 DIAGNOSIS — K629 Disease of anus and rectum, unspecified: Secondary | ICD-10-CM

## 2012-12-20 DIAGNOSIS — K219 Gastro-esophageal reflux disease without esophagitis: Secondary | ICD-10-CM

## 2012-12-20 DIAGNOSIS — Z23 Encounter for immunization: Secondary | ICD-10-CM

## 2012-12-20 DIAGNOSIS — K5900 Constipation, unspecified: Secondary | ICD-10-CM

## 2012-12-20 DIAGNOSIS — K649 Unspecified hemorrhoids: Secondary | ICD-10-CM

## 2012-12-20 MED ORDER — TIZANIDINE HCL 4 MG PO CAPS: 4.0000 mg | ORAL_CAPSULE | Freq: Three times a day (TID) | ORAL | Status: DC

## 2012-12-20 MED ORDER — PANTOPRAZOLE SODIUM 40 MG PO TBEC: 40.0000 mg | DELAYED_RELEASE_TABLET | Freq: Every morning | ORAL | Status: DC

## 2012-12-20 MED ORDER — HYDROCORTISONE ACETATE 25 MG RE SUPP: 25.0000 mg | Freq: Two times a day (BID) | RECTAL | Status: DC

## 2012-12-20 NOTE — Progress Notes (Signed)
[facility] Richard Rojas - 22 y.o. male MRN [id number]  Date of birth: Jun 28, 1990  CC, HPI, Interval History & ROS  Richard Rojas  The patient reports he has had ongoing issues with rectal Rojas.  He was previously seen for this and has been given anti-fungals as well as only one episode of topical treatment for hemorrhoids.  He does report significant Rojas with defecation and will occasionally have a bulging from his anus with Valsalva.  There was a single noted lesion that was hypopigmented previously.  He continues to have significant itching.  He also is here to followup on his chronic back Rojas.  He was seen by his pediatric orthopedists who have discharged him and are recommending chronic Rojas management if indicated.  They do not think there is any indication to intervene with his loosening hardware.  He has not had any formal physical therapy and is a candidate due to Medicaid status.  He has tried Flexeril without significant relief.  He would like a refill on his reflux medications he continues to heartburn on a frequent basis.  Reports his mood is stable.  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  See overview  Other Pertinent Med/Surg/Hosp History: # Mood disorder and suicidal ideation  # Prior suppurative hydradenitis  Follow up Issues:      Due for flu shot and Prevnar  History   Social History  . Marital Status: Single    Spouse Name: N/A    Number of Children: N/A  . Years of Education: N/A   Social History Main Topics  . Smoking status: Current Some  Day Smoker -- 0.30 packs/day for 2 years    Types: Cigarettes    Last Attempt to Quit: 11/12/2011  . Smokeless tobacco: Never Used  . Alcohol Use: No  . Drug Use: 2.00 per week    Special: Marijuana     Comment: every day use x 5 years   . Sexual Activity: Yes    Partners: Female, Male    Birth Control/ Protection: Condom     Comment: Male and male sex/ Males are preference   Other Topics Concern  . None   Social History Narrative  . None     Otherwise please see associated EMR sections for complete problem List, past medical history, past surgical history, family history and social history. Objective Findings  VITALS: HR: 85 bpm  BP: 121/65 mmHg  TEMP: 98.2 F (36.8 C) (Oral)  RESP:   HT: 6\' 1"  (185.4 cm)  WT: 137 lb 12.8 oz (62.506 kg)  BMI: Body mass index is 18.18 kg/(m^2).   BP Readings from Last 3 Encounters:  12/20/12 121/65  11/13/12 110/63  10/11/12 114/66   Wt Readings from Last 3 Encounters:  12/20/12 137 lb 12.8 oz (62.506 kg)  11/13/12 140 lb (63.504 kg)  10/11/12 139 lb (63.05 kg)     PHYSICAL EXAM: GENERAL:  tall, thin African American young adult male. In no discomfort; no respiratory distress  PSYCH: alert but slowly withdrawn from conversation.  Answers questions appropriately, good insight   HNEENT:   CARDIO: RRR, S1/S2  heard, no murmur  LUNGS: CTA B, no wheezes, no crackles  ABDOMEN:   EXTREM:    GU:  external rectal exam performed today and there is a well-circumscribed, hypopigmented macular lesion that is approximately 0.7 cm in diameter with an adjacent spiculated hypopigmented lesion closer to the anal verge that is smaller than the dominant lesion   SKIN:    Medications & Orders   Previous Medications   DIVALPROEX (DEPAKOTE) 125 MG DR TABLET    Take 125 mg by mouth 3 (three) times daily.   ELVITEGRAVIR-COBICISTAT-EMTRICITABINE-TENOFOVIR (STRIBILD) 150-150-200-300 MG TABS    Take 1 tablet by mouth daily with breakfast.   RISPERIDONE  (RISPERDAL PO)    Take 1 tablet by mouth daily.   Modified Medications   Modified Medication Previous Medication   PANTOPRAZOLE (PROTONIX) 40 MG TABLET pantoprazole (PROTONIX) 40 MG tablet      Take 1 tablet (40 mg total) by mouth every morning.    Take 40 mg by mouth every morning.   New Prescriptions   HYDROCORTISONE (ANUSOL-HC) 25 MG SUPPOSITORY    Place 1 suppository (25 mg total) rectally 2 (two) times daily.   TIZANIDINE (ZANAFLEX) 4 MG CAPSULE    Take 1 capsule (4 mg total) by mouth 3 (three) times daily.   Discontinued Medications   No medications on file   Orders Placed This Encounter  Procedures  . Pneumococcal polysaccharide vaccine 23-valent greater than or equal to 2yo subcutaneous/IM    Assessment & Plan   Problems addressed today: General Instructions:  1. Chronic back Rojas   2. Constipation   3. Anal lesion   4. Internal hemorrhoid   5. Need for prophylactic vaccination against Streptococcus pneumoniae (pneumococcus)   6. Need for prophylactic vaccination and inoculation against influenza   7. SCOLIOSIS   8. GERD (gastroesophageal reflux disease)   9. Hemorrhoids       Start annusol  Increase Fiber in diet - recommend Metamucil  Start Zanaflex for muscle relaxer  Refilled PPI today  Follow up with ID   Flu and Prevnar today  Back Exercises per hand out     For further discussion of A/P and for follow up issues see problem based charting.

## 2012-12-20 NOTE — Patient Instructions (Signed)
It was nice to see you today, thanks for coming in!   Start annusol  Increase Fiber in diet - recommend Metamucil  Start Zanaflex for muscle relaxer  Refilled PPI today  Follow up with ID   Flu and Prevnar  Back Exercises per hand out   Please plan to return to see me in 6 months   If you need anything prior to your next visit please call the clinic. Please Bring all medications or accurate medication list with you to each appointment; an accurate medication list is essential in providing you the best care possible.

## 2012-12-22 ENCOUNTER — Encounter: Payer: Self-pay | Admitting: Sports Medicine

## 2012-12-22 NOTE — Assessment & Plan Note (Addendum)
I am unclear as to exactly what this is as has many providers previously.  I am somewhat concerned however that this lesion seems to be changing per patient report and prior records.  There does appear to be a new spiculated lesion adjacent to the prior hypopigmented lesion noted previously.  Obviously my primary concern would be any neoplastic lesion such as anal cancer.  It very well could be hypopigmentation from prior trauma or topical corticosteroid use however the patient denies any significant or prolonged use or any trauma to. I considered doing an anal Pap smear today however do not feel like this is the appropriate tests given this is on the perianal epithelium and not mucosa.   After discussing with Dr. Deirdre Priest, I have asked him to followup with infectious disease for their continued expertise.  I do think a sample of this is warranted either way but am unsure an anal Pap smear would give [place] the information needed to thoroughly evaluate this.  I will defer to infectious disease as to whether or not an anal Pap is indicated or if he should be referred for a direct tissue biopsy under the care of a proctologist or general surgeon.

## 2012-12-22 NOTE — Assessment & Plan Note (Signed)
Treat constipation with addition of Metamucil into daily diet. > Consider addition of MiraLax if not improved

## 2012-12-22 NOTE — Assessment & Plan Note (Addendum)
Recommend formal physical therapy however he is unable to because of Medicaid Home exercises given today per patient advisor. Start Zanaflex as muscle relaxer for centrally acting effect.  > Consider referral to pain management if not improved the patient is less inclined to use chronic pain medications

## 2012-12-22 NOTE — Assessment & Plan Note (Signed)
Will treat symptomatically for internal hemorrhoids.  Given known constipation and occasional bulging

## 2012-12-22 NOTE — Assessment & Plan Note (Signed)
Requesting refill on PPI

## 2013-01-06 ENCOUNTER — Encounter: Payer: Self-pay | Admitting: *Deleted

## 2013-01-06 NOTE — Telephone Encounter (Signed)
Error

## 2013-02-16 ENCOUNTER — Other Ambulatory Visit: Payer: Self-pay

## 2013-02-24 ENCOUNTER — Emergency Department (HOSPITAL_COMMUNITY)
Admission: EM | Admit: 2013-02-24 | Discharge: 2013-02-24 | Disposition: A | Discharge status: Other Acute Inpatient Hospital | Payer: Medicaid Other | Source: Home / Self Care | Attending: Emergency Medicine | Admitting: Emergency Medicine

## 2013-02-24 ENCOUNTER — Encounter (HOSPITAL_COMMUNITY): Payer: Self-pay | Admitting: *Deleted

## 2013-02-24 ENCOUNTER — Encounter: Payer: Self-pay | Admitting: Infectious Disease

## 2013-02-24 ENCOUNTER — Inpatient Hospital Stay (HOSPITAL_COMMUNITY)
Admission: EM | Admit: 2013-02-24 | Discharge: 2013-03-01 | DRG: 885 | Disposition: A | Discharge status: Home / Self Care | Payer: Medicaid Other | Source: Intra-hospital | Attending: Psychiatry | Admitting: Psychiatry

## 2013-02-24 ENCOUNTER — Ambulatory Visit (INDEPENDENT_AMBULATORY_CARE_PROVIDER_SITE_OTHER): Payer: Medicaid Other | Admitting: Infectious Disease

## 2013-02-24 ENCOUNTER — Encounter (HOSPITAL_COMMUNITY): Payer: Self-pay | Admitting: Emergency Medicine

## 2013-02-24 VITALS — BP 122/83 | HR 74 | Temp 98.2°F | Ht 74.0 in | Wt 143.0 lb

## 2013-02-24 DIAGNOSIS — A539 Syphilis, unspecified: Secondary | ICD-10-CM

## 2013-02-24 DIAGNOSIS — F319 Bipolar disorder, unspecified: Secondary | ICD-10-CM

## 2013-02-24 DIAGNOSIS — Z872 Personal history of diseases of the skin and subcutaneous tissue: Secondary | ICD-10-CM | POA: Insufficient documentation

## 2013-02-24 DIAGNOSIS — F1410 Cocaine abuse, uncomplicated: Secondary | ICD-10-CM | POA: Diagnosis present

## 2013-02-24 DIAGNOSIS — Z21 Asymptomatic human immunodeficiency virus [HIV] infection status: Secondary | ICD-10-CM | POA: Insufficient documentation

## 2013-02-24 DIAGNOSIS — F314 Bipolar disorder, current episode depressed, severe, without psychotic features: Secondary | ICD-10-CM | POA: Diagnosis present

## 2013-02-24 DIAGNOSIS — G8929 Other chronic pain: Secondary | ICD-10-CM

## 2013-02-24 DIAGNOSIS — R110 Nausea: Secondary | ICD-10-CM

## 2013-02-24 DIAGNOSIS — F1210 Cannabis abuse, uncomplicated: Secondary | ICD-10-CM | POA: Diagnosis present

## 2013-02-24 DIAGNOSIS — B20 Human immunodeficiency virus [HIV] disease: Secondary | ICD-10-CM

## 2013-02-24 DIAGNOSIS — R443 Hallucinations, unspecified: Secondary | ICD-10-CM | POA: Insufficient documentation

## 2013-02-24 DIAGNOSIS — K5900 Constipation, unspecified: Secondary | ICD-10-CM

## 2013-02-24 DIAGNOSIS — F315 Bipolar disorder, current episode depressed, severe, with psychotic features: Principal | ICD-10-CM | POA: Diagnosis present

## 2013-02-24 DIAGNOSIS — E559 Vitamin D deficiency, unspecified: Secondary | ICD-10-CM

## 2013-02-24 DIAGNOSIS — F411 Generalized anxiety disorder: Secondary | ICD-10-CM | POA: Diagnosis present

## 2013-02-24 DIAGNOSIS — F17200 Nicotine dependence, unspecified, uncomplicated: Secondary | ICD-10-CM | POA: Insufficient documentation

## 2013-02-24 DIAGNOSIS — F3130 Bipolar disorder, current episode depressed, mild or moderate severity, unspecified: Secondary | ICD-10-CM

## 2013-02-24 DIAGNOSIS — M4120 Other idiopathic scoliosis, site unspecified: Secondary | ICD-10-CM | POA: Diagnosis present

## 2013-02-24 DIAGNOSIS — G4700 Insomnia, unspecified: Secondary | ICD-10-CM | POA: Diagnosis present

## 2013-02-24 DIAGNOSIS — Z7251 High risk heterosexual behavior: Secondary | ICD-10-CM

## 2013-02-24 DIAGNOSIS — F322 Major depressive disorder, single episode, severe without psychotic features: Secondary | ICD-10-CM

## 2013-02-24 DIAGNOSIS — K648 Other hemorrhoids: Secondary | ICD-10-CM

## 2013-02-24 DIAGNOSIS — R45850 Homicidal ideations: Secondary | ICD-10-CM

## 2013-02-24 DIAGNOSIS — Z8739 Personal history of other diseases of the musculoskeletal system and connective tissue: Secondary | ICD-10-CM | POA: Insufficient documentation

## 2013-02-24 DIAGNOSIS — F1414 Cocaine abuse with cocaine-induced mood disorder: Secondary | ICD-10-CM | POA: Diagnosis present

## 2013-02-24 DIAGNOSIS — K629 Disease of anus and rectum, unspecified: Secondary | ICD-10-CM

## 2013-02-24 DIAGNOSIS — Z79899 Other long term (current) drug therapy: Secondary | ICD-10-CM

## 2013-02-24 DIAGNOSIS — K219 Gastro-esophageal reflux disease without esophagitis: Secondary | ICD-10-CM

## 2013-02-24 DIAGNOSIS — R45851 Suicidal ideations: Secondary | ICD-10-CM

## 2013-02-24 DIAGNOSIS — F1994 Other psychoactive substance use, unspecified with psychoactive substance-induced mood disorder: Secondary | ICD-10-CM | POA: Diagnosis present

## 2013-02-24 DIAGNOSIS — K649 Unspecified hemorrhoids: Secondary | ICD-10-CM

## 2013-02-24 DIAGNOSIS — Z87828 Personal history of other (healed) physical injury and trauma: Secondary | ICD-10-CM | POA: Insufficient documentation

## 2013-02-24 LAB — COMPREHENSIVE METABOLIC PANEL
ALT: 9 U/L (ref 0–53)
Albumin: 4.4 g/dL (ref 3.5–5.2)
Alkaline Phosphatase: 96 U/L (ref 39–117)
BUN: 12 mg/dL (ref 6–23)
Chloride: 103 mEq/L (ref 96–112)
GFR calc Af Amer: >90 mL/min (ref >90)
Glucose, Bld: 97 mg/dL (ref 70–99)
Potassium: 3.9 mEq/L (ref 3.5–5.1)
Sodium: 138 mEq/L (ref 135–145)
Total Bilirubin: 0.5 mg/dL (ref 0.3–1.2)

## 2013-02-24 LAB — CBC
HCT: 45.4 % (ref 39.0–52.0)
Hemoglobin: 15.8 g/dL (ref 13.0–17.0)
WBC: 5.3 10*3/uL (ref 4.0–10.5)

## 2013-02-24 LAB — RAPID URINE DRUG SCREEN, HOSP PERFORMED
Barbiturates: NOT DETECTED
Benzodiazepines: NOT DETECTED
Cocaine: POSITIVE — AB
Tetrahydrocannabinol: POSITIVE — AB

## 2013-02-24 LAB — ACETAMINOPHEN LEVEL: Acetaminophen (Tylenol), Serum: <15 ug/mL (ref 10–30)

## 2013-02-24 LAB — ETHANOL: Alcohol, Ethyl (B): <11 mg/dL (ref 0–11)

## 2013-02-24 MED ORDER — ZOLPIDEM TARTRATE 5 MG PO TABS: 5.0000 mg | ORAL_TABLET | Freq: Every evening | ORAL | Status: DC | PRN

## 2013-02-24 MED ORDER — ELVITEG-COBIC-EMTRICIT-TENOFDF 150-150-200-300 MG PO TABS
1.0000 | ORAL_TABLET | Freq: Every day | ORAL | Status: DC
  Filled 2013-02-24: qty 1

## 2013-02-24 MED ORDER — LORAZEPAM 1 MG PO TABS: 1.0000 mg | ORAL_TABLET | Freq: Three times a day (TID) | ORAL | Status: DC | PRN

## 2013-02-24 MED ORDER — PANTOPRAZOLE SODIUM 40 MG PO TBEC: 40.0000 mg | DELAYED_RELEASE_TABLET | Freq: Every morning | ORAL | Status: DC

## 2013-02-24 MED ORDER — NICOTINE 21 MG/24HR TD PT24
21.0000 mg | MEDICATED_PATCH | Freq: Every day | TRANSDERMAL | Status: DC
  Administered 2013-02-24: 21 mg via TRANSDERMAL
  Filled 2013-02-24: qty 1

## 2013-02-24 MED ORDER — ALUM & MAG HYDROXIDE-SIMETH 200-200-20 MG/5ML PO SUSP: 30.0000 mL | ORAL | Status: DC | PRN

## 2013-02-24 MED ORDER — ACETAMINOPHEN 325 MG PO TABS: 650.0000 mg | ORAL_TABLET | ORAL | Status: DC | PRN

## 2013-02-24 MED ORDER — IBUPROFEN 200 MG PO TABS: 600.0000 mg | ORAL_TABLET | Freq: Three times a day (TID) | ORAL | Status: DC | PRN

## 2013-02-24 MED ORDER — ONDANSETRON HCL 4 MG PO TABS: 4.0000 mg | ORAL_TABLET | Freq: Three times a day (TID) | ORAL | Status: DC | PRN

## 2013-02-24 NOTE — Progress Notes (Signed)
Subjective:    Patient ID: Richard Rojas, male    DOB: 12-16-1990, 22 y.o.   MRN: [id number]  HPI   Richard Rojas is a 22 y.o. male who is doing superbly well on his  antiviral regimen, STRIBILD with undetectable viral load and health cd4 count.   He continues to have trouble still with bipolar has been off of all his medications for this disorder.  When talking to my nurse in talking to me voiced active homicidal ideation with desire to kill his mother. He states that he feels the need to keep knives away from himself due to his fear that he would use them to harm his mother or another person.  He also endorses passive suicidal ideation on bad days but no active suicidal ideation or plan of hurting himself.  He is not having hallucinations or clear delusions. He is living by himself and not with his mother anymore. He is very irritable  Review of Systems  Constitutional: Positive for activity change and appetite change. Negative for fever, chills, diaphoresis, fatigue and unexpected weight change.  HENT: Negative for congestion, rhinorrhea, sinus pressure, sneezing, sore throat and trouble swallowing.   Eyes: Negative for photophobia and visual disturbance.  Respiratory: Negative for cough, chest tightness, shortness of breath, wheezing and stridor.   Cardiovascular: Negative for chest pain, palpitations and leg swelling.  Gastrointestinal: Negative for nausea, vomiting, abdominal pain, diarrhea, constipation, blood in stool, abdominal distention and anal bleeding.  Genitourinary: Negative for dysuria, hematuria, flank pain and difficulty urinating.  Musculoskeletal: Negative for arthralgias, back pain, gait problem, joint swelling and myalgias.  Skin: Negative for color change, pallor, rash and wound.  Neurological: Negative for dizziness, tremors, weakness and light-headedness.  Hematological: Negative for adenopathy. Bruises/bleeds easily.  Psychiatric/Behavioral: Positive for  suicidal ideas, dysphoric mood and decreased concentration. Negative for hallucinations, behavioral problems, confusion, sleep disturbance, self-injury and agitation. The patient is nervous/anxious. The patient is not hyperactive.        Objective:   Physical Exam  Constitutional: He is oriented to person, place, and time. He appears well-developed and well-nourished. No distress.  HENT:  Head: Normocephalic and atraumatic.  Mouth/Throat: Oropharynx is clear and moist.  Eyes: Conjunctivae and EOM are normal.  Neck: Normal range of motion. Neck supple.  Cardiovascular: Normal rate and regular rhythm.   Pulmonary/Chest: Effort normal. No respiratory distress. He has no wheezes.  Abdominal: He exhibits no distension.  Musculoskeletal: He exhibits no edema and no tenderness.  Neurological: He is alert and oriented to person, place, and time. He has normal reflexes. He exhibits normal muscle tone. Coordination normal.  Skin: Skin is warm and dry. He is not diaphoretic. No erythema. No pallor.  Psychiatric: His behavior is normal. Judgment and thought content normal. His mood appears anxious.          Assessment & Plan:    Bipolar disorder with homicidal ideation:   We're sending him to [facility] for triage in the emergency department and hopefully admission to inpatient psychiatry so his homicidal ideation passive suicidal ideation and bipolar disorder can be better treated.  I will check his HLA [id number] status in case we might benefit from changing him to try a back to avoid any drug interactions between the COBIsistat and psych meds though Tivicay does also have some drug interactions with a few AEDs HIV: excellent control continue STRIBILD  Hair color change on scrotum/; asked him to try clotrimazole to this area  I spent greater  than 20 minutes with the patient including greater than 50% of time in face to face counsel of the patient and in coordination of their care.   HIV:  This is very well controlled he does have daily nausea which he states he had before he was placed on STRIBILD. I would consider changing him to [facility] possibly

## 2013-02-24 NOTE — ED Notes (Signed)
Pt transferred from triage, presents with HI feelings towards mother, pt reports he does not get along with his mother. Pt diag, with Bipolar, HIV and rods in back related to Scoliosis, states he has not taken his meds in 3 mos.  Pt reports he is hearing voices telling him to hurt other people with no plan.  Pt calm & cooperative at present.

## 2013-02-24 NOTE — BH Assessment (Signed)
Informed Ava S. That pt needs a TTS consult.   Glorious Peach, MS, LCASA Assessment Counselor

## 2013-02-24 NOTE — ED Notes (Signed)
Pt brought from Infectious disease office for medical clearance. Pt reports HI and SI. Pt denies using any recreational drugs today. Pt denies having a plan for SI. Pt reports intermittent auditory hallucinations that instruct him to kill himself and others. Pt reports that he hasn't taken any of his bipolar medications in three months.

## 2013-02-24 NOTE — Progress Notes (Signed)
Writer consulted with the NP Brett Albino) regarding the patient meeting criteria for inpatient hospitalization.  Writer was informed by the [facility] Minerva Areola) that the patient has been accepted to Bed 405-1.  Dr. Jannifer Franklin is the accepting doctor.  Writer informed the ER MD (Dr. Karma Ganja) and the nurse working with the patient (LaTrishia).  Writer faxed the support paperwork to [facility].

## 2013-02-24 NOTE — ED Provider Notes (Signed)
CSN: [id number]     Arrival date & time 02/24/13  1739 History   First MD Initiated Contact with Patient 02/24/13 1803     Chief Complaint  Patient presents with  . Medical Clearance   (Consider location/radiation/quality/duration/timing/severity/associated sxs/prior Treatment) HPI Pt with hx of bipolar discorder, HIV presents from ID clinic due to expression homicidal thoughts there against his mother.  He states that he stopped taking his bipolar meds 2-3 months ago. He states the meds did not help his thought patterns but made him feel sleepy. He states he occasionally has auditory hallucinations but "not more than usual".  He states the intermittent voices do tell him to kill himself and others.  He endorses feeling that he is out of control of his emotions.  No fever/chills.  No recent illness. There are no other associated systemic symptoms, there are no other alleviating or modifying factors.   Past Medical History  Diagnosis Date  . Scoliosis   . Syphilis 2012, june    not clear he completed treatment.  non-compliant with follow up.  . Syphilis 08/09/2010  . HIV (human immunodeficiency virus infection)   . Gonorrhea in male   . Subungual hematoma of finger 03/23/2012  . 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 Tinea versicolor 10/08/2010  . Scabies 12/17/2011  . Boil 10/11/2012   Past Surgical History  Procedure Laterality Date  . Spine surgery      harrington rod placement for scoliosis  . Back surgery     No family history on file. History  Substance Use Topics  . Smoking status: Current Some Day Smoker -- 0.30 packs/day for 2 years    Types: Cigarettes    Last Attempt to Quit: 11/12/2011  . Smokeless tobacco: Never Used  . Alcohol Use: No    Review of Systems ROS reviewed and all otherwise negative except for mentioned in HPI  Allergies  Review of patient's allergies indicates no known allergies.  Home Medications   No current outpatient prescriptions on file. BP  115/55  Pulse 80  Temp(Src) 98.1 F (36.7 C) (Oral)  Resp 20  SpO2 97% Vitals reviewed Physical Exam Physical Examination: General appearance - alert, well appearing, and in no distress Mental status - alert, oriented to person, place, and time Eyes - no conjunctival injection, no scleral icterus Mouth - mucous membranes moist, pharynx normal without lesions Chest - clear to auscultation, no wheezes, rales or rhonchi, symmetric air entry Heart - normal rate, regular rhythm, normal S1, S2, no murmurs, rubs, clicks or gallops Extremities - peripheral pulses normal, no pedal edema, no clubbing or cyanosis Skin - normal coloration and turgor, no rashes Psych- flat affect, cooperative and calm  ED Course  Procedures (including critical care time) Labs Review Labs Reviewed  SALICYLATE LEVEL - Abnormal; Notable for the following:    Salicylate Lvl <2.0 (*)    All other components within normal limits  URINE RAPID DRUG SCREEN (HOSP PERFORMED) - Abnormal; Notable for the following:    Cocaine POSITIVE (*)    Tetrahydrocannabinol POSITIVE (*)    All other components within normal limits  ACETAMINOPHEN LEVEL  CBC  COMPREHENSIVE METABOLIC PANEL  ETHANOL   Imaging Review No results found.  EKG Interpretation   None       MDM   1. Bipolar 1 disorder   2. Hallucinations    Pt presenting due to thoughts of homicide and suicide along with auditory command hallucinations.  He has bipolar and has been off meds  for several months.  Pt has HIV and was just seen at ID clinic today- note on chart.  Pt medically cleared and will need psych eval.    Note- pt was accepted at [facility] for inpatient treatment    Ethelda Chick, MD 02/24/13 2335

## 2013-02-24 NOTE — BH Assessment (Addendum)
Assessment Note  Patient is a 22 year old African American that is hearing voices with commands. Patient reports that the voices told him to, "take a knife from [facility] and stab his mother to death".  Patient reports that he does have a strained relationship with his mother.  Patient reports that he does need assistance with being able to control his anger.  Patient reports that he does not live with his mother.   Patient reports that the voices are also telling him to, "take an overdose of his medication".  Patient reports overwhelming feelings of hopelessness, depression and anger.  Patient denies any prior psychiatric hospitalizations.   Patient has a diagnosis of Bipolar Disorder.   Patient reports that he has been off of all his medications for this disorder for three months.  Patient receives his medication from [facility] the [place].  Patient reports that he stopped taking his medication because it was making his sleep, "all day long".    Patient reports that he receives outpatient [facility].  Patient reports that he has not met with his counselor in over three months.   Patient denies substance abuse; however, his UDS is positive for cocaine and marijuana.   Patients BAL is <11.      Axis I: Bipolar, Depressed Axis II: Deferred Axis III:  Past Medical History  Diagnosis Date  . Scoliosis   . Syphilis 2012, june    not clear he completed treatment.  non-compliant with follow up.  . Syphilis 08/09/2010  . HIV (human immunodeficiency virus infection)   . Gonorrhea in male   . Subungual hematoma of finger 03/23/2012  . 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 Tinea versicolor 10/08/2010  . Scabies 12/17/2011  . Boil 10/11/2012   Axis IV: economic problems, other psychosocial or environmental problems, problems related to social environment and problems with access to health care services Axis V: 21-30 behavior considerably influenced by  delusions or hallucinations OR serious impairment in judgment, communication OR inability to function in almost all areas  Past Medical History:  Past Medical History  Diagnosis Date  . Scoliosis   . Syphilis 2012, june    not clear he completed treatment.  non-compliant with follow up.  . Syphilis 08/09/2010  . HIV (human immunodeficiency virus infection)   . Gonorrhea in male   . Subungual hematoma of finger 03/23/2012  . 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 Tinea versicolor 10/08/2010  . Scabies 12/17/2011  . Boil 10/11/2012    Past Surgical History  Procedure Laterality Date  . Spine surgery      harrington rod placement for scoliosis  . Back surgery      Family History: No family history on file.  Social History:  reports that he has been smoking Cigarettes.  He has a .6 pack-year smoking history. He has never used smokeless tobacco. He reports that he uses illicit drugs (Marijuana) about twice per week. He reports that he does not drink alcohol.  Additional Social History:     CIWA: CIWA-Ar BP: 121/86 mmHg Pulse Rate: 90 COWS:    Allergies: No Known Allergies  Home Medications:  (Not in a hospital admission)  OB/GYN Status:  No LMP for male patient.  General Assessment Data Location of Assessment: [facility] ED Is this a Tele or Face-to-Face Assessment?: Face-to-Face Is this an Initial Assessment or a Re-assessment for this encounter?: Initial Assessment Living Arrangements: Alone Can pt return to current living arrangement?: Yes Admission Status:  Voluntary Is patient capable of signing voluntary admission?: Yes Transfer from: Acute Hospital Referral Source: Self/Family/Friend  Medical Screening Exam [facility] Walk-in ONLY) Medical Exam completed: Yes  [facility] Crisis Care Plan Living Arrangements: Alone Name of Psychiatrist: Unable to remember the doctors name. Name of Therapist: Brooke Dare  Education Status Is patient currently in school?: No Current Grade: NA Highest  grade of school patient has completed: NA Name of school: NA Contact person: NA  Risk to self Suicidal Ideation: Yes-Currently Present Suicidal Intent: Yes-Currently Present Is patient at risk for suicide?: Yes Suicidal Plan?: Yes-Currently Present Specify Current Suicidal Plan: Taking an overdose of medication  Access to Means: Yes Specify Access to Suicidal Means: pills for HIV What has been your use of drugs/alcohol within the last 12 months?: None Previous Attempts/Gestures: No How many times?: 0 Other Self Harm Risks: None  Triggers for Past Attempts: Family contact Intentional Self Injurious Behavior: None Family Suicide History: No Recent stressful life event(s): Job Loss;Financial Problems Persecutory voices/beliefs?: Yes Depression: Yes Depression Symptoms: Feeling angry/irritable;Despondent;Insomnia;Fatigue;Loss of interest in usual pleasures Substance abuse history and/or treatment for substance abuse?: Yes Suicide prevention information given to non-admitted patients: Yes  Risk to Others Homicidal Ideation: Yes-Currently Present Thoughts of Harm to Others: Yes-Currently Present Comment - Thoughts of Harm to Others: wanting to cut his mother  Current Homicidal Intent: Yes-Currently Present Current Homicidal Plan: Yes-Currently Present Describe Current Homicidal Plan: Using a knife from [place] to stab his mother to death. Access to Homicidal Means: Yes Describe Access to Homicidal Means: Knife Identified Victim: His Mother History of harm to others?: Yes Assessment of Violence: None Noted Violent Behavior Description: Calm Does patient have access to weapons?: Yes (Comment) Criminal Charges Pending?: No Does patient have a court date: No  Psychosis Hallucinations: Visual Delusions: None noted  Mental Status Report Appear/Hygiene: Disheveled Eye Contact: Fair Motor Activity: Freedom of movement Speech: Soft Level of Consciousness: Alert;Irritable Mood:  Anxious;Guilty;Worthless, low self-esteem Affect: Depressed;Irritable Anxiety Level: Minimal Thought Processes: Coherent Judgement: Unimpaired Orientation: Person;Place;Time;Situation Obsessive Compulsive Thoughts/Behaviors: None  Cognitive Functioning Concentration: Decreased Memory: Recent Intact;Remote Intact IQ: Average Insight: Fair Impulse Control: Poor Appetite: Fair Weight Loss: 0 Weight Gain: 0 Sleep: Decreased Total Hours of Sleep: 5 Vegetative Symptoms: None  ADLScreening [facility] Assessment Services) Patient's cognitive ability adequate to safely complete daily activities?: Yes Patient able to express need for assistance with ADLs?: Yes Independently performs ADLs?: Yes (appropriate for developmental age)  Prior Inpatient Therapy Prior Inpatient Therapy: No Prior Therapy Dates: NA Prior Therapy Facilty/Provider(s): NA Reason for Treatment: NA  Prior Outpatient Therapy Prior Outpatient Therapy: Yes Prior Therapy Dates: Ongoing Prior Therapy [facility] ) Reason for Treatment: Medication Management  (Therapy)  ADL Screening (condition at time of admission) Patient's cognitive ability adequate to safely complete daily activities?: Yes Patient able to express need for assistance with ADLs?: Yes Independently performs ADLs?: Yes (appropriate for developmental age)         Values / Beliefs Cultural Requests During Hospitalization: None Spiritual Requests During Hospitalization: None        Additional Information 1:1 In Past 12 Months?: No CIRT Risk: No Elopement Risk: No Does patient have medical clearance?: Yes     Disposition: Pending psych disposition. Disposition Initial Assessment Completed for this Encounter: Yes Disposition of Patient: Inpatient treatment program Type of inpatient treatment program: Adult  On Site Evaluation by:   Reviewed with Physician:    Phillip Heal  [place] 02/24/2013 8:49 PM

## 2013-02-24 NOTE — ED Provider Notes (Signed)
9:31 PM per TTS pt has been accepted to [facility], Dr. Jannifer Franklin accepting.    Ethelda Chick, MD 02/24/13 [phone]

## 2013-02-25 DIAGNOSIS — R45851 Suicidal ideations: Secondary | ICD-10-CM

## 2013-02-25 DIAGNOSIS — F1210 Cannabis abuse, uncomplicated: Secondary | ICD-10-CM | POA: Diagnosis present

## 2013-02-25 DIAGNOSIS — R45850 Homicidal ideations: Secondary | ICD-10-CM

## 2013-02-25 DIAGNOSIS — F1414 Cocaine abuse with cocaine-induced mood disorder: Secondary | ICD-10-CM | POA: Diagnosis present

## 2013-02-25 DIAGNOSIS — F319 Bipolar disorder, unspecified: Secondary | ICD-10-CM | POA: Diagnosis present

## 2013-02-25 DIAGNOSIS — F314 Bipolar disorder, current episode depressed, severe, without psychotic features: Secondary | ICD-10-CM | POA: Diagnosis present

## 2013-02-25 MED ORDER — PANTOPRAZOLE SODIUM 40 MG PO TBEC
40.0000 mg | DELAYED_RELEASE_TABLET | Freq: Every day | ORAL | Status: DC
  Administered 2013-02-25 – 2013-03-01 (×5): 40 mg via ORAL
  Filled 2013-02-25 (×6): qty 1

## 2013-02-25 MED ORDER — NICOTINE 21 MG/24HR TD PT24
21.0000 mg | MEDICATED_PATCH | Freq: Every day | TRANSDERMAL | Status: DC
  Administered 2013-02-25 – 2013-03-01 (×5): 21 mg via TRANSDERMAL
  Filled 2013-02-25 (×7): qty 1

## 2013-02-25 MED ORDER — GABAPENTIN 600 MG PO TABS
300.0000 mg | ORAL_TABLET | Freq: Two times a day (BID) | ORAL | Status: DC
  Administered 2013-02-25: 300 mg via ORAL
  Filled 2013-02-25 (×5): qty 0.5

## 2013-02-25 MED ORDER — ALUM & MAG HYDROXIDE-SIMETH 200-200-20 MG/5ML PO SUSP: 30.0000 mL | ORAL | Status: DC | PRN

## 2013-02-25 MED ORDER — ELVITEG-COBIC-EMTRICIT-TENOFDF 150-150-200-300 MG PO TABS
1.0000 | ORAL_TABLET | Freq: Every day | ORAL | Status: DC
  Administered 2013-02-26: 13:00:00 via ORAL
  Administered 2013-02-27 – 2013-03-01 (×3): 1 via ORAL
  Filled 2013-02-25 (×5): qty 1

## 2013-02-25 MED ORDER — ONDANSETRON HCL 4 MG PO TABS
4.0000 mg | ORAL_TABLET | Freq: Three times a day (TID) | ORAL | Status: DC | PRN
  Administered 2013-02-25 – 2013-03-01 (×8): 4 mg via ORAL
  Filled 2013-02-25 (×8): qty 1

## 2013-02-25 MED ORDER — ARIPIPRAZOLE 5 MG PO TABS
5.0000 mg | ORAL_TABLET | Freq: Every day | ORAL | Status: DC
  Administered 2013-02-25 – 2013-02-28 (×4): 5 mg via ORAL
  Filled 2013-02-25 (×6): qty 1

## 2013-02-25 MED ORDER — ACETAMINOPHEN 325 MG PO TABS
650.0000 mg | ORAL_TABLET | Freq: Four times a day (QID) | ORAL | Status: DC | PRN
  Administered 2013-02-25 – 2013-02-28 (×3): 650 mg via ORAL
  Filled 2013-02-25 (×3): qty 2

## 2013-02-25 MED ORDER — MAGNESIUM HYDROXIDE 400 MG/5ML PO SUSP: 30.0000 mL | Freq: Every day | ORAL | Status: DC | PRN

## 2013-02-25 MED ORDER — TRAZODONE HCL 50 MG PO TABS
50.0000 mg | ORAL_TABLET | Freq: Every evening | ORAL | Status: DC | PRN
  Administered 2013-02-25 – 2013-02-28 (×5): 50 mg via ORAL
  Filled 2013-02-25: qty 1
  Filled 2013-02-25: qty 6
  Filled 2013-02-25 (×4): qty 1

## 2013-02-25 MED ORDER — GABAPENTIN 300 MG PO CAPS
300.0000 mg | ORAL_CAPSULE | Freq: Two times a day (BID) | ORAL | Status: DC
  Administered 2013-02-25 – 2013-03-01 (×8): 300 mg via ORAL
  Filled 2013-02-25 (×10): qty 1
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
## 2013-02-25 NOTE — Progress Notes (Signed)
Adult Psychoeducational Group Note  Date:  02/25/2013 Time:  11:37 AM  Group Topic/Focus:  Early Warning Signs:   The focus of this group is to help patients identify signs or symptoms they exhibit before slipping into an unhealthy state or crisis.  Participation Level:  Did Not Attend  Additional Comments:  Pt was encouraged to come to group but pt stayed in bed.   Cathlean Cower 02/25/2013, 11:37 AM

## 2013-02-25 NOTE — Tx Team (Signed)
Initial Interdisciplinary Treatment Plan  PATIENT STRENGTHS: (choose at least two) Ability for insight Average or above average intelligence Communication skills General fund of knowledge Supportive family/friends  PATIENT STRESSORS: Health problems Medication change or noncompliance Substance abuse   PROBLEM LIST: Problem List/Patient Goals Date to be addressed Date deferred Reason deferred Estimated date of resolution  Homicidal Ideations 02-23-13     HIV positive 02-23-13     Hx. Back Surgery 02-23-13     Depression 02-23-13      Medication Compliance 02-23-13                              DISCHARGE CRITERIA:  Ability to meet basic life and health needs Improved stabilization in mood, thinking, and/or behavior Need for constant or close observation no longer present Reduction of life-threatening or endangering symptoms to within safe limits  PRELIMINARY DISCHARGE PLAN: Attend aftercare/continuing care group Outpatient therapy Participate in family therapy  PATIENT/FAMIILY INVOLVEMENT: This treatment plan has been presented to and reviewed with the patient, KAMRAN COKER, and/or family member.  The patient and family have been given the opportunity to ask questions and make suggestions.  Mickeal Needy 02/25/2013, 12:21 AM

## 2013-02-25 NOTE — [facility] Group Notes (Signed)
[facility] LCSW Aftercare Discharge Planning Group Note   02/25/2013 8:05 AM  Participation Quality:  Engaged  Mood/Affect:  Depressed  Depression Rating:  5  Anxiety Rating:  5  Thoughts of Suicide:  No Will you contract for safety?   NA  Current AVH:  No  Plan for Discharge/Comments:  Richard Rojas states that it was his idea to come in because he knew he needed some help.  "I've just been mean spirited."  States he quit taking meds prescribed at [facility] because "they weren't helping anyway."  Not working, not in school.  Limited family supports.  Hopes we can find a different medication that can help, and is hoping not to be here long.  Another patient baited him, and he responded in kind, but later apologized to the group.  Transportation Means: unk  Supports: limited  [place], [place] B

## 2013-02-25 NOTE — Progress Notes (Signed)
Patient ID: JACOBIE STAMEY, male   DOB: 09-13-1990, 22 y.o.   MRN: [id number] Pt. Is a 23 yo male admitted with suicidal ideations and homicidal Ideations reporting voices tell him hurt himself, mom and dad and others. Pt. Reports he has been off his medication for 3 months. Pt. Reports no inpatient treatment. Pt. Says he's here because "my attitude got bad, went to the doctor, they sent me here" Pt. Reports he goes to "[facility] for medication Pt. Said he stopped going because "I didn't like Verlon Au, the lady who prescribed my medicine" Pt. Reports he had been taking Depakote and Risperdal. "It didn't help all it did was make me sleep." Pt. Reports "my mom and everybody else be pissing me off" Pt. Currently contracts for safety. Pt. Is compliant during this admission. Pt. Reports he smoke weed a little bit. "maybe a half of a blunt every other day" Pt. Is childlike, initially guarded and irritable, but is cooperative. Pt. Has a medical history of HIV, back surgery which he reports causes him constant pain. Pt. Lives alone, but has supportive significant other. Pt. Asking how long he will be here, states that his sister will graduating from [employer] and he wants to attend on Saturday night. Staff gave pt snacks and drink. Pt. Oriented to unit and room. Pt. Will be monitored q82min for safety.
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
## 2013-02-25 NOTE — Tx Team (Signed)
  Interdisciplinary Treatment Plan Update   Date Reviewed:  02/25/2013  Time Reviewed:  8:04 AM  Progress in Treatment:   Attending groups: Yes Participating in groups: Yes Taking medication as prescribed: Yes  Tolerating medication: Yes Family/Significant other contact made: No Patient understands diagnosis: Yes AEB asking for help with voices, irritability and mood stabilization Discussing patient identified problems/goals with staff: Yes See initial care plan Medical problems stabilized or resolved: Yes Denies suicidal/homicidal ideation: Yes  In tx team Patient has not harmed self or others: Yes  For review of initial/current patient goals, please see plan of care.  Estimated Length of Stay:  4-5 days  Reason for Continuation of Hospitalization: Depression Hallucinations Medication stabilization Other; describe Mood stabilization  New Problems/Goals identified:  N/A  Discharge Plan or Barriers:   return home, follow up [facility]  Additional Comments:  Pt. Is a 22 yo male admitted with suicidal ideations and homicidal Ideations reporting voices tell him hurt himself, mom and dad and others. Pt. Reports he has been off his medication for 3 months. Pt. Reports no inpatient treatment. Pt. Says he's here because "my attitude got bad, went to the doctor, they sent me here" Pt. Reports he goes to "[facility] for medication  Pt. Said he stopped going because "I didn't like Verlon Au, the lady who prescribed my medicine" Pt. Reports he had been taking Depakote and Risperdal. "It didn't help all it did was make me sleep." Pt. Reports "my mom and everybody else be pissing me off" Pt. Reports he smoke weed a little bit. "maybe a half of a blunt every other day" Pt. Is childlike, initially guarded and irritable, but is cooperative. Pt. Has a medical history of HIV, back surgery which he reports causes him constant pain. Pt. Lives alone, but has supportive significant other.   Attendees:   Signature: Thedore Mins, MD 02/25/2013 8:04 AM   Signature: Richelle Ito, LCSW 02/25/2013 8:04 AM  Signature: Fransisca Kaufmann, NP 02/25/2013 8:04 AM  Signature: Joslyn Devon, RN 02/25/2013 8:04 AM  Signature: Liborio Nixon, RN 02/25/2013 8:04 AM  Signature:  02/25/2013 8:04 AM  Signature:   02/25/2013 8:04 AM  Signature:    Signature:    Signature:    Signature:    Signature:    Signature:      Scribe for Treatment Team:   Richelle Ito, LCSW  02/25/2013 8:04 AM
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
## 2013-02-25 NOTE — Progress Notes (Signed)
Patient denies SI, HI, AVH. Contracts for safety. Patient appears restless and anxious. Patient spent time in hall with [place], MHT, stating the dayroom was too cold. Reports that his anxiety has improved throughout the day. Feels that medication is helping. States that he has been waking with nausea in the morning and it is likely a symptom of his anxiety. Patient reported to MHT Jasmine that he wrote a letter to his mother and would like to send it to her.   Encouragement offered. Patient given abilify and prn trazodone. Resting quietly in bed.  Patient safety maintained, Q 15 checks continue.

## 2013-02-25 NOTE — Progress Notes (Signed)
Patient ID: Richard Rojas, male   DOB: May 17, 1990, 22 y.o.   MRN: [id number] 02-25-2013 nursing shift note: D: pt has been visible in the milieu. He is going to groups and interacting with his peers. A: he got a new order for his HIV medications, that will start tomorrow in the am and neurontin for his mood,  aggressive and agitated mood. He is having some passive SI but is able to contract verbally. Also he has made some threatening remarks regarding his mother, but no plan to harm her.  R: on his inventory sheet he wrote; he requested sleep medication, appetite poor, energy low, attention good with his depression and hopelessness both at 5. No w/d symptoms. Physical problems in the last 24 hrs have been n/v. After discharge he plans to " take all my medication the way i am suppose to and stay out of trouble". RN will monitor and Q 15 min ck's continue.

## 2013-02-26 NOTE — Progress Notes (Signed)
Patient ID: Richard Rojas, male   DOB: 04-03-1990, 22 y.o.   MRN: [id number]    S: see today. Reports better mood now.  Good sleep last night. Thinks he needs more sleep now.   O:   Psychiatric Specialty Exam:     Review of Systems  Constitutional: Negative.  HENT: Negative.  Eyes: Negative.  Respiratory: Negative.  Cardiovascular: Negative.  Gastrointestinal: negative  Genitourinary: Negative.  Musculoskeletal: Negative.  Skin: Negative.  Neurological: Negative.  Endo/Heme/Allergies: Negative.  Psychiatric/Behavioral: Positive for depression    .   General Appearance: Disheveled   Eye Contact:: Good   Speech: Clear and Coherent   Volume: Normal   Mood: Dysphoric   Affect: Flat   Thought Process: Goal Directed   Orientation: Full (Time, Place, and Person)   Thought Content: denies Hallucinations:   Suicidal Thoughts: no  Homicidal Thoughts: no  Memory: Immediate; Good  Recent; Good  Remote; Good   Judgement: Impaired   Insight: Shallow   Psychomotor Activity: Decreased   Concentration: Fair   Recall: Good   Akathisia: No   Handed: Right    Psychological Evaluations:  Assessment:  DSM5:  AXIS I: Bipolar 1 disorder, most recent episode depressed, severe, with psychotic behavior, Cocaine abuse with cocaine-induced mood disorder  AXIS II: Deferred  AXIS III:  Past Medical History   Diagnosis  Date   .  Scoliosis    .  Syphilis  2012, june     not clear he completed treatment. non-compliant with follow up.   .  Syphilis  08/09/2010   .  HIV (human immunodeficiency virus infection)    .  Gonorrhea in male    .  Subungual hematoma of finger  03/23/2012   .  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  Tinea versicolor  10/08/2010   .  Scabies  12/17/2011   .  Boil  10/11/2012    AXIS IV: other psychosocial or environmental problems and problems with primary support group  AXIS V: 31-40 impairment in reality testing    Treatment Plan/Recommendations:   1.continue current meds.  Treatment Plan Summary:   Daily contact with patient to assess and evaluate symptoms and progress in treatment  Medication management

## 2013-02-26 NOTE — Progress Notes (Signed)
Took over Pt's care at 2330.  It had been reported off that Pt has had nausea and vomiting this evening.  Checked on Pt and he appears to be asleep, respirations even and unlabored, no distress noted.  Will continue to monitor throughout.

## 2013-02-26 NOTE — Progress Notes (Addendum)
Patient ID: Richard Rojas, male   DOB: 12/08/1990, 22 y.o.   MRN: [id number] D: patient is lying in bed this morning complaining of nausea/vomiting this morning.  Patient came to med window and became very irate stating, "I'm going back to bed.  I'm sick.  You can bring my medications to my room." Patient received zofran  Met with patient in room and he stated, "I want some orange juice.  Ginger ale makes me throw up."  Explained to patient that orange juice would not help with his nausea.  He is currently sleeping and has not been up to take his medications.  A: continue to monitor medication management and MD orders.  Safety checks completed every 15 minutes per protocol.  R: patient is unwilling to participate today.

## 2013-02-26 NOTE — Progress Notes (Signed)
Psychoeducational Group Note  Date:  02/26/2013 Time:  8:00 p.m.   Group Topic/Focus:  Wrap-Up Group:   The focus of this group is to help patients review their daily goal of treatment and discuss progress on daily workbooks.  Participation Level: Did Not Attend  Participation Quality:  Not Applicable  Affect:  Not Applicable  Cognitive:  Not Applicable  Insight:  Not Applicable  Engagement in Group: Not Applicable  Additional Comments: The patient did not attend group this evening since he was asleep in his bed.  Hazle Coca S 02/26/2013, 11:51 PM

## 2013-02-26 NOTE — [facility] Group Notes (Signed)
[facility] Group Notes: (Clinical Social Work)   02/26/2013      Type of Therapy:  Group Therapy   Participation Level:  Did Not Attend    Ambrose Mantle, LCSW 02/26/2013, 1:24 PM

## 2013-02-26 NOTE — Progress Notes (Signed)
D   Pt was interacting with select peers in the dayroom   He has attended the outside group today   He can be intrusive at times and anxious   He asked appropriate questions about his medications   He said the neurontin was helping with his anxiety and helps to calm him down A    Verbal support given   Medications administered and effectiveness monitored  Q 15 min checks R   Pt safe at present

## 2013-02-26 NOTE — Progress Notes (Signed)
Pt in his room throwing up  Have already given Zofran and pt threw that up too  Received gator aide  Will report same to oncoming nurse  Pt safe at present

## 2013-02-27 MED ORDER — HYDROXYZINE HCL 25 MG PO TABS
25.0000 mg | ORAL_TABLET | Freq: Once | ORAL | Status: AC
  Administered 2013-02-27: 25 mg via ORAL
  Filled 2013-02-27 (×2): qty 1

## 2013-02-27 MED ORDER — ESCITALOPRAM OXALATE 10 MG PO TABS
10.0000 mg | ORAL_TABLET | Freq: Every day | ORAL | Status: DC
  Administered 2013-02-28 – 2013-03-01 (×2): 10 mg via ORAL
  Filled 2013-02-27 (×3): qty 1
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
## 2013-02-27 NOTE — Progress Notes (Signed)
Patient ID: NATHANYAL ASHMEAD, male   DOB: Jul 05, 1990, 22 y.o.   MRN: [id number] 02-27-13 nursing shift note: D: pt has complained of anxiety today and of n/v this am.  He stated he has n/v every morning. He did come to groups. A: RN obtained a one time order for vistaril and administered Zofran for the n/v. Staff continues to support, encourage and praise this patient. R: he denied any si/hi/av. On his inventory sheet he wrote: slept fair, appetite poor, energy normal, attention good with his depression and anxiety both at 0. W/d symptoms have been cravings , but he has not mentioned them verbally to staff. Physical problems in the last 24 hrs have been headaches. After discharge he plan to "stay on my med's". RN will monitor and Q 15 min ck's continue.

## 2013-02-27 NOTE — Progress Notes (Signed)
Patient ID: Richard Rojas, male   DOB: [birth year], 22 y.o.   MRN: [id number] Psychoeducational Group Note  Date:  02/27/2013 Time:  0930am  Group Topic/Focus:  Making Healthy Choices:   The focus of this group is to help patients identify negative/unhealthy choices they were using prior to admission and identify positive/healthier coping strategies to replace them upon discharge.  Participation Level:  Active  Participation Quality:  Appropriate  Affect:  Anxious  Cognitive:  Appropriate  Insight:  Supportive  Engagement in Group:  Supportive  Additional Comments:  Inventory and Psychoeducational group- healthy support systems.   Valente David 02/27/2013,10:31 AM

## 2013-02-27 NOTE — Progress Notes (Signed)
Patient ID: Richard Rojas, male   DOB: [birth year], 22 y.o.   MRN: [id number]    S: feels more anxious today. Per pt he has GI upset (nausea) for the last 1 year now. Thinks it is may be related with anxiety.   O:   Psychiatric Specialty Exam:     Review of Systems  Constitutional: Negative.  HENT: Negative.  Eyes: Negative.  Respiratory: Negative.  Cardiovascular: Negative.  Gastrointestinal: negative  Genitourinary: Negative.  Musculoskeletal: Negative.  Skin: Negative.  Neurological: Negative.  Endo/Heme/Allergies: Negative.  Psychiatric/Behavioral: Positive for depression    .   General Appearance: Disheveled   Eye Contact:: Good   Speech: Clear and Coherent   Volume: Normal   Mood: anxious   Affect: Flat   Thought Process: Goal Directed   Orientation: Full (Time, Place, and Person)   Thought Content: denies Hallucinations:   Suicidal Thoughts: no  Homicidal Thoughts: no  Memory: Immediate; Good  Recent; Good  Remote; Good   Judgement: Impaired   Insight: Shallow   Psychomotor Activity: Decreased   Concentration: Fair   Recall: Good   Akathisia: No   Handed: Right    Psychological Evaluations:  Assessment:  DSM5:  AXIS I: Bipolar 1 disorder, most recent episode depressed, severe, with psychotic behavior, Cocaine abuse with cocaine-induced mood disorder  AXIS II: Deferred  AXIS III:  Past Medical History   Diagnosis  Date   .  Scoliosis    .  Syphilis  2012, june     not clear he completed treatment. non-compliant with follow up.   .  Syphilis  08/09/2010   .  HIV (human immunodeficiency virus infection)    .  Gonorrhea in male    .  Subungual hematoma of finger  03/23/2012   .  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  Tinea versicolor  10/08/2010   .  Scabies  12/17/2011   .  Boil  10/11/2012    AXIS IV: other psychosocial or environmental problems and problems with primary support group  AXIS V: 31-40 impairment in reality testing    Treatment  Plan/Recommendations:    Will start Lexapro 10 mg qam for anxiety  Treatment Plan Summary:   Daily contact with patient to assess and evaluate symptoms and progress in treatment  Medication management

## 2013-02-27 NOTE — [facility] Group Notes (Signed)
[facility] Group Notes:  (Clinical Social Work)  02/27/2013   11:15am-12:00pm  Summary of Progress/Problems:  The main focus of today's process group was to listen to a variety of genres of music and to identify that different types of music provoke different responses.  The patient then was able to identify personally what was soothing for them, as well as energizing.  Handouts were used to record feelings evoked, as well as how patient can personally use this knowledge in sleep habits, with depression, and with other symptoms.  The patient expressed understanding of concepts, as well as knowledge of how each type of music affected them and how this can be used when they are at home as a tool in their recovery.  Type of Therapy:  Music Therapy   Participation Level:  Active  Participation Quality:  Attentive and Sharing  Affect:  Appropriate, Broad  Cognitive:  Oriented  Insight:  Engaged  Engagement in Therapy:  Engaged  Modes of Intervention:   Activity, Exploration  Ambrose Mantle, LCSW 02/27/2013, 12:30pm
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
## 2013-02-28 DIAGNOSIS — F315 Bipolar disorder, current episode depressed, severe, with psychotic features: Principal | ICD-10-CM

## 2013-02-28 DIAGNOSIS — F1994 Other psychoactive substance use, unspecified with psychoactive substance-induced mood disorder: Secondary | ICD-10-CM

## 2013-02-28 MED ORDER — ARIPIPRAZOLE ER 400 MG IM SUSR
400.0000 mg | INTRAMUSCULAR | Status: DC
  Administered 2013-02-28: 400 mg via INTRAMUSCULAR

## 2013-02-28 NOTE — [facility] Group Notes (Signed)
[facility] LCSW Group Therapy  02/28/2013 1:15 pm  Type of Therapy: Process Group Therapy  Participation Level:  Active  Participation Quality:  Appropriate  Affect:  Flat  Cognitive:  Oriented  Insight:  Improving  Engagement in Group:  Limited  Engagement in Therapy:  Limited  Modes of Intervention:  Activity, Clarification, Education, Problem-solving and Support  Summary of Progress/Problems: Today's group addressed the issue of overcoming obstacles.  Patients were asked to identify their biggest obstacle post d/c that stands in the way of their on-going success, and then problem solve as to how to manage this.  Joyce talked about his anger and how he goes from "0-1000 miles an hour in the blink of an eye."  He states that he is feeling more optimistic going forward because he is taking meds that he feels are helping, he has gotten some ideas from staff about alternatives for anger control, like walking away, ignoring and counting before responding.  Also, he says that his caseworker [facility], is a big support, and he plans on employing his help as he leaves here.  States that they were not in close contact previous to inpt admission.  Daryel Gerald B 02/28/2013   3:11 PM

## 2013-02-28 NOTE — Progress Notes (Signed)
The focus of this group is to help patients review their daily goal of treatment and discuss progress on daily workbooks. Pt attended the evening group session and responded to all discussion prompts from the Writer. Pt shared that today was a good day due to the fact that he received his Abilify injection, which he felt optimistic about. Pt reported having no additional needs from Nursing Staff this evening beyond towels and lotion, which were given to him following group. Pt volunteered several positive comments to his peers in wrap-up and demonstrated great patience when a peer became loud and disruptive next to him. Pt's affect was slightly depressed.

## 2013-02-28 NOTE — Progress Notes (Signed)
Recreation Therapy Notes  Date: 12.15.2014 Time: 9:30am Location: [address] Dayroom   Group Topic: Wellness  Goal Area(s) Addresses:  Patient will define components of whole wellness. Patient will verbalize benefit of whole wellness.  Behavioral Response: Appropriate   Intervention: Group Mind Map  Activity: Patients were asked to create a group flow chart outlining the dimension of wellness, as well as components of each dimension.    Education: Wellness, Building control surveyor.    Education Outcome: Acknowledges understanding  Clinical Observations/Feedback: Patient with peers identified the following dimensions of wellness: physical, emotional, mental, spiritual, leisure and community. Patient gave examples of each dimension as well as successfully related each dimension to one another.  Patient additionally highlighted the importance of investing in each dimension post d/c, patient related this to staying well.   Marykay Lex Rider Ermis, LRT/CTRS  Emiya Loomer L 02/28/2013 11:47 AM

## 2013-02-28 NOTE — Progress Notes (Signed)
[facility] MD Progress Note  02/28/2013 2:55 PM Richard Rojas  MRN:  [id number] Subjective:   Patient states "I feel much happier. The voices have been slowly decreasing since starting on the new medication. I feel like I am on a regimen that works better for me. I am not feeling so sedated. It's a good idea to start on the monthly injectable."   Objective:  Patient has been more visible on the unit over the last few days. He has been very irritable on the unit at times mostly over conflict with peer. Patient required verbal de escalation on several occasions. However, the patient is reporting significant improvement in his psychotic symptoms. The patient has made comments about hitting a certain peer if further provoked but has not acted out during this admission. Patient agreeable to receive first dose of injectable Abilify today.   Diagnosis:   DSM5: AXIS I: Bipolar 1 disorder, most recent episode depressed, severe, with psychotic behavior, Cocaine abuse with cocaine-induced mood disorder  AXIS II: Deferred  AXIS III:  Past Medical History   Diagnosis  Date   .  Scoliosis    .  Syphilis  2012, june     not clear he completed treatment. non-compliant with follow up.   .  Syphilis  08/09/2010   .  HIV (human immunodeficiency virus infection)    .  Gonorrhea in male    .  Subungual hematoma of finger  03/23/2012   .  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  Tinea versicolor  10/08/2010   .  Scabies  12/17/2011   .  Boil  10/11/2012   AXIS IV: other psychosocial or environmental problems and problems with primary support group  AXIS V: 31-40 impairment in reality testing   ADL's:  Intact  Sleep: Good  Appetite:  Fair  Suicidal Ideation:  Denies Homicidal Ideation:  Denies AEB (as evidenced by):  Psychiatric Specialty Exam: Review of Systems  Constitutional: Negative.   HENT: Negative.   Eyes: Negative.   Respiratory: Negative.   Cardiovascular: Negative.   Gastrointestinal:  Negative.   Genitourinary: Negative.   Musculoskeletal: Negative.   Skin: Negative.   Neurological: Negative.   Endo/Heme/Allergies: Negative.   Psychiatric/Behavioral: Positive for depression and substance abuse. Negative for suicidal ideas, hallucinations and memory loss. The patient is nervous/anxious. The patient does not have insomnia.     Blood pressure 159/91, pulse 105, temperature 97.6 F (36.4 C), temperature source Oral, resp. rate 17, height 6\' 2"  (1.88 m), weight 64.864 kg (143 lb).Body mass index is 18.35 kg/(m^2).  General Appearance: Casual  Eye Contact::  Good  Speech:  Clear and Coherent  Volume:  Normal  Mood:  Anxious and Irritable  Affect:  Blunt  Thought Process:  Goal Directed and Intact  Orientation:  Full (Time, Place, and Person)  Thought Content:  WDL  Suicidal Thoughts:  No  Homicidal Thoughts:  No  Memory:  Immediate;   Good Recent;   Good Remote;   Good  Judgement:  Fair  Insight:  Shallow  Psychomotor Activity:  Normal  Concentration:  Fair  Recall:  Good  Akathisia:  No  Handed:  Right  AIMS (if indicated):     Assets:  Communication Skills Desire for Improvement Housing Intimacy Leisure Time Resilience  Sleep:  Number of Hours: 6.75   Current Medications: Current Facility-Administered Medications  Medication Dose Route Frequency Provider Last Rate Last Dose  . acetaminophen (TYLENOL) tablet 650 mg  650 mg Oral  Q6H PRN Audrea Muscat, NP   650 mg at 02/28/13 1134  . alum & mag hydroxide-simeth (MAALOX/MYLANTA) 200-200-20 MG/5ML suspension 30 mL  30 mL Oral Q4H PRN Evanna Janann August, NP      . ARIPiprazole (ABILIFY) tablet 5 mg  5 mg Oral QHS Mojeed Akintayo   5 mg at 02/27/13 2210  . ARIPiprazole SUSR 400 mg  400 mg Intramuscular Q28 days Mojeed Akintayo   400 mg at 02/28/13 1135  . elvitegravir-cobicistat-emtricitabine-tenofovir (STRIBILD) 150-150-200-300 MG tablet 1 tablet  1 tablet Oral Q breakfast Mojeed Akintayo   1 tablet at  02/28/13 0750  . escitalopram (LEXAPRO) tablet 10 mg  10 mg Oral Daily Wonda Cerise, MD   10 mg at 02/28/13 0750  . gabapentin (NEURONTIN) capsule 300 mg  300 mg Oral BID Mojeed Akintayo   300 mg at 02/28/13 0750  . magnesium hydroxide (MILK OF MAGNESIA) suspension 30 mL  30 mL Oral Daily PRN Evanna Janann August, NP      . nicotine (NICODERM CQ - dosed in mg/24 hours) patch 21 mg  21 mg Transdermal Daily Mojeed Akintayo   21 mg at 02/28/13 0753  . ondansetron (ZOFRAN) tablet 4 mg  4 mg Oral Q8H PRN Fransisca Kaufmann, NP   4 mg at 02/28/13 1610  . pantoprazole (PROTONIX) EC tablet 40 mg  40 mg Oral Daily Mojeed Akintayo   40 mg at 02/28/13 0750  . traZODone (DESYREL) tablet 50 mg  50 mg Oral QHS PRN,MR X 1 Evanna Cori Merry Proud, NP   50 mg at 02/27/13 2210    Lab Results: No results found for this or any previous visit (from the past 48 hour(s)).  Physical Findings: AIMS: Facial and Oral Movements Muscles of Facial Expression: None, normal Lips and Perioral Area: None, normal Jaw: None, normal Tongue: None, normal,Extremity Movements Upper (arms, wrists, hands, fingers): None, normal Lower (legs, knees, ankles, toes): None, normal, Trunk Movements Neck, shoulders, hips: None, normal, Overall Severity Severity of abnormal movements (highest score from questions above): None, normal Incapacitation due to abnormal movements: None, normal Patient's awareness of abnormal movements (rate only patient's report): No Awareness, Dental Status Current problems with teeth and/or dentures?: No Does patient usually wear dentures?: No  CIWA:    COWS:     Treatment Plan Summary: Daily contact with patient to assess and evaluate symptoms and progress in treatment Medication management  Plan: Continue crisis management and stabilization.  Medication management: Reviewed with patient who stated no untoward effects. Continue Abilify 5 mg hs, Lexapro 10 mg daily. Initiate Abilify 400 mg injection monthly times  one to start on ongoing monthly basis.  Encouraged patient to attend groups and participate in group counseling sessions and activities.  Discharge plan in progress. Anticipate d/c tomorrow.  Continue current treatment plan.  Address health issues: Vitals reviewed and stable. Patient will resume care with ID MD after d/c.   Medical Decision Making Problem Points:  Established problem, stable/improving (1) and Review of psycho-social stressors (1) Data Points:  Review of medication regiment & side effects (2)  I certify that inpatient services furnished can reasonably be expected to improve the patient's condition.   Winnie Barsky NP-C 02/28/2013, 2:55 PM

## 2013-02-28 NOTE — Progress Notes (Signed)
D:  Patient up and active in the milieu.  Irritable this morning because he did not wake up for breakfast and was not brought a tray.  MHT gave him a cup or orange juice and he was very appreciative.  He later apologized for his negative comments.  He has been attending groups and is spending time in the day room.  He rates depression at 2 and denies hopelessness.  Anxiety has been mild.  Requested Tylenol once for mild back ache.   A:  Medications given as prescribed.  Patient started on Abilify injection today.  Explained the medication and the schedule to him.  Offered support and encouragement.   R:  Patient verbalized understanding of medication.  Tolerated the injection well.  Has been much less irritable is the day has progressed.  Is interacting well with peers and has been cooperative with staff after initial encounter this morning.  No evidence of auditory hallucinations at this time.  Safety is maintained.

## 2013-03-01 DIAGNOSIS — F1210 Cannabis abuse, uncomplicated: Secondary | ICD-10-CM

## 2013-03-01 DIAGNOSIS — F314 Bipolar disorder, current episode depressed, severe, without psychotic features: Secondary | ICD-10-CM

## 2013-03-01 DIAGNOSIS — F1410 Cocaine abuse, uncomplicated: Secondary | ICD-10-CM

## 2013-03-01 MED ORDER — TRAZODONE HCL 50 MG PO TABS: 50.0000 mg | ORAL_TABLET | Freq: Every evening | ORAL | Status: AC | PRN

## 2013-03-01 MED ORDER — ARIPIPRAZOLE 5 MG PO TABS: 5.0000 mg | ORAL_TABLET | Freq: Every day | ORAL | Status: DC

## 2013-03-01 MED ORDER — ESCITALOPRAM OXALATE 10 MG PO TABS: 10.0000 mg | ORAL_TABLET | Freq: Every day | ORAL | Status: AC

## 2013-03-01 MED ORDER — ARIPIPRAZOLE ER 400 MG IM SUSR: 400.0000 mg | INTRAMUSCULAR | Status: AC

## 2013-03-01 MED ORDER — ARIPIPRAZOLE ER 400 MG IM SUSR: 400.0000 mg | INTRAMUSCULAR | Status: DC

## 2013-03-01 MED ORDER — ELVITEG-COBIC-EMTRICIT-TENOFDF 150-150-200-300 MG PO TABS: 1.0000 | ORAL_TABLET | Freq: Every day | ORAL | Status: DC

## 2013-03-01 MED ORDER — GABAPENTIN 300 MG PO CAPS: 300.0000 mg | ORAL_CAPSULE | Freq: Two times a day (BID) | ORAL | Status: DC

## 2013-03-01 MED ORDER — PANTOPRAZOLE SODIUM 40 MG PO TBEC: 40.0000 mg | DELAYED_RELEASE_TABLET | Freq: Every morning | ORAL | Status: DC

## 2013-03-01 NOTE — Progress Notes (Signed)
D:  Patient discharged to home today.  Has been up and visible in the milieu.  Attending and participating in groups.  Denies suicidal ideation or depressive symptoms at this time.  Denies any side effects to injection he received yesterday.  All belongings retrieved from room and from locker number 34 in the search room.  A:  Reviewed all discharge instructions, medications, and follow up care.  Stressed importance of follow up with Vesta Mixer so that he would be able to stay on schedule with his psychiatric medications.   R:  Has been pleasant and cooperative today.  Verbalizes understanding of all instructions.  States he feels ready to leave the hospital setting.  Escorted patient off the unit and to the front lobby.

## 2013-03-01 NOTE — Discharge Summary (Signed)
Physician Discharge Summary Note  Patient:  Richard Rojas is an 22 y.o., male MRN:  [id number] DOB:  [birth year] Patient phone:  [phone] (home)  Patient address:   [address] [place] [address],   Date of Admission:  02/24/2013 Date of Discharge: 03/01/13  Reason for Admission:  Psychosis, Suicidal Ideation   Discharge Diagnoses: Principal Problem:   Bipolar I disorder, most recent episode (or current) depressed, severe, with psychotic behavior Active Problems:   Bipolar 1 disorder   Cannabis abuse   Cocaine abuse with cocaine-induced mood disorder  Review of Systems  Constitutional: Negative.   HENT: Negative.   Eyes: Negative.   Respiratory: Negative.   Cardiovascular: Negative.   Gastrointestinal: Negative.   Genitourinary: Negative.   Musculoskeletal: Negative.   Skin: Negative.   Neurological: Negative.   Endo/Heme/Allergies: Negative.   Psychiatric/Behavioral: Positive for substance abuse. Negative for depression, suicidal ideas, hallucinations and memory loss. The patient is not nervous/anxious and does not have insomnia.     DSM5:  AXIS I: Bipolar I disorder, most recent episode (or current) depressed, severe, without mention of psychotic behavior  Cocaine use disorder  Cannabis use disorder  AXIS II: Deferred  AXIS III:  Past Medical History   Diagnosis  Date   .  Scoliosis    .  Syphilis  2012, june     not clear he completed treatment. non-compliant with follow up.   .  Syphilis  08/09/2010   .  HIV (human immunodeficiency virus infection)    .  Gonorrhea in male    .  Subungual hematoma of finger  03/23/2012   .  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  Tinea versicolor  10/08/2010   .  Scabies  12/17/2011   .  Boil  10/11/2012    AXIS IV: economic problems, other psychosocial or environmental problems and problems with primary support group  AXIS V: 61-70 mild symptoms   Level of Care:  OP  Hospital Course:  Richard Rojas is a 22  year old male was was being seen at [facility] for Infectious Disease for HIV follow up when he expressed suicidal and homicidal ideations to the staff. He was sent to [facility] for medical clearance. The patient has been off his Bipolar medications for three months and he started to hear auditory hallucinations telling him to kill self and others. Patient states today on psychiatric assessment "The medications make me too sleepy. Nothing has really helped. I hear voices to hurt my parents. I don't get along with my mother. My father is a Science writer. I feel very irritable and keep getting an attitude with people. I would be open to being on an injectable medication because I don't like taking medications." His urine drug screen is positive for cocaine and marijuana. However, the patient does not admit to using the cocaine only the marijuana.          Richard Rojas was admitted to the adult unit. He was evaluated and his symptoms were identified. Medication management was discussed and initiated. His medications were changed at his request. The patient was started on Abilify 5 mg at hs for psychosis and Lexapro 10 mg daily for depression. He was oriented to the unit and encouraged to participate in unit programming. Medical problems were identified and treated appropriately. His HIV medication from prior to admission was continued. Home medication was restarted as needed.        The patient was  evaluated each day by a clinical provider to ascertain the patient's response to treatment.  Improvement was noted by the patient's report of decreasing symptoms, improved sleep and appetite, affect, medication tolerance, behavior, and participation in unit programming.  Richard Rojas was asked each day to complete a self inventory noting mood, mental status, pain, new symptoms, anxiety and concerns. The patient reported that he liked the new regimen and wished to continue on it. Prior to his discharge patient  received his first dose of Abilify Maintena 400 mg as the patient expressed interest in having to take his medications less often.          He responded well to medication and being in a therapeutic and supportive environment. Positive and appropriate behavior was noted and the patient was motivated for recovery.  [name] worked closely with the treatment team and case manager to develop a discharge plan with appropriate goals. Coping skills, problem solving as well as relaxation therapies were also part of the unit programming.         By the day of discharge Richard Rojas was in much improved condition than upon admission.  Symptoms were reported as significantly decreased or resolved completely.  The patient denied SI/HI and voiced no AVH. He was motivated to continue taking medication with a goal of continued improvement in mental health.          Richard Rojas was discharged home with a plan to follow up as noted below.  Consults:  None  Significant Diagnostic Studies:  labs: Admission labs completed   Discharge Vitals:   Blood pressure 113/77, pulse 83, temperature 97.4 F (36.3 C), temperature source Oral, resp. rate 18, height 6\' 2"  (1.88 m), weight 64.864 kg (143 lb). Body mass index is 18.35 kg/(m^2). Lab Results:   No results found for this or any previous visit (from the past 72 hour(s)).  Physical Findings: AIMS: Facial and Oral Movements Muscles of Facial Expression: None, normal Lips and Perioral Area: None, normal Jaw: None, normal Tongue: None, normal,Extremity Movements Upper (arms, wrists, hands, fingers): None, normal Lower (legs, knees, ankles, toes): None, normal, Trunk Movements Neck, shoulders, hips: None, normal, Overall Severity Severity of abnormal movements (highest score from questions above): None, normal Incapacitation due to abnormal movements: None, normal Patient's awareness of abnormal movements (rate only patient's report): No Awareness,  Dental Status Current problems with teeth and/or dentures?: No Does patient usually wear dentures?: No  CIWA:    COWS:     Psychiatric Specialty Exam: See Psychiatric Specialty Exam and Suicide Risk Assessment completed by Attending Physician prior to discharge.  Discharge destination:  Home  Is patient on multiple antipsychotic therapies at discharge:  No   Has Patient had three or more failed trials of antipsychotic monotherapy by history:  No  Recommended Plan for Multiple Antipsychotic Therapies: NA  Discharge Orders   Future Appointments Provider Department Dept Phone   03/21/2013 2:45 PM Randall Hiss, MD [facility] [phone]   Future Orders Complete By Expires   Increase activity slowly  As directed        Medication List    STOP taking these medications       divalproex 125 MG DR tablet  Commonly known as:  DEPAKOTE     risperidone 4 MG tablet  Commonly known as:  RISPERDAL      TAKE these medications     Indication   ARIPiprazole 5  MG tablet  Commonly known as:  ABILIFY  Take 1 tablet (5 mg total) by mouth at bedtime.   Indication:  Manic-Depression     ARIPiprazole 400 MG Susr  Commonly known as:  ABILIFY MAINTENA  Inject 400 mg into the muscle every 28 (twenty-eight) days.   Indication:  Bipolar Disorder     elvitegravir-cobicistat-emtricitabine-tenofovir 150-150-200-300 MG Tabs tablet  Commonly known as:  STRIBILD  Take 1 tablet by mouth daily with breakfast.   Indication:  HIV Disease     escitalopram 10 MG tablet  Commonly known as:  LEXAPRO  Take 1 tablet (10 mg total) by mouth daily.   Indication:  Depression     gabapentin 300 MG capsule  Commonly known as:  NEURONTIN  Take 1 capsule (300 mg total) by mouth 2 (two) times daily.   Indication:  Agitation     pantoprazole 40 MG tablet  Commonly known as:  PROTONIX  Take 1 tablet (40 mg total) by mouth every morning.   Indication:  GERD      traZODone 50 MG tablet  Commonly known as:  DESYREL  Take 1 tablet (50 mg total) by mouth at bedtime as needed and may repeat dose one time if needed for sleep.   Indication:  Trouble Sleeping           Follow-up Information   Follow up with [facility]. (Go to the walk-in clinic M-F between 8 and 9AM for hospital follow up appointment)    Contact information:   [address]  [place]  [phone]      Follow-up recommendations:   Activity: as tolerated  Diet: healthy  Tests: routine  Other: patient to keep his after care appointment   Total Discharge Time:  Greater than 30 minutes.  SignedFransisca Kaufmann NP-C 03/01/2013, 9:08 AM

## 2013-03-01 NOTE — Progress Notes (Signed)
D   Pt s mood has improved since a few days ago   He remains somewhat anxious and sad but does show improvement and is much calmer and tolerant   He interacts well with others and is active in groups   He verbalizes and understanding of his medications and their uses    A   Verbal support given   Medications administered and effectiveness monitored    Q 15 min checks   Reinforce positive behaviors and praise progress   R   Pt safe at present

## 2013-03-01 NOTE — Progress Notes (Signed)
[facility] Adult Case Management Discharge Plan :  Will you be returning to the same living situation after discharge: Yes,  home At discharge, do you have transportation home?:Yes,  family Do you have the ability to pay for your medications:Yes,  mental health  Release of information consent forms completed and in the chart;  Patient's signature needed at discharge.  Patient to Follow up at: Follow-up Information   Follow up with [facility]. (Go to the walk-in clinic M-F between 8 and 9AM for hospital follow up appointment)    Contact information:   [address]  [place]  [336] [phone]      Patient denies SI/HI:   Yes,  yes    Safety Planning and Suicide Prevention discussed:  Yes,  yes  Ida Rogue 03/01/2013, 10:22 AM

## 2013-03-01 NOTE — [facility] Suicide Risk Assessment (Signed)
Suicide Risk Assessment  Discharge Assessment     Demographic Factors:  Male  Mental Status Per Nursing Assessment::   On Admission:  Thoughts of violence towards others  Current Mental Status by Physician: patient denies suicidal ideation, intent, plan  Loss Factors: Financial problems/change in socioeconomic status  Historical Factors: Family history of mental illness or substance abuse and Impulsivity  Risk Reduction Factors:   Resolving mood swings and psychosis  Continued Clinical Symptoms:  Alcohol/Substance Abuse/Dependencies  Cognitive Features That Contribute To Risk:  Closed-mindedness Polarized thinking    Suicide Risk:  Minimal: No identifiable suicidal ideation.  Patients presenting with no risk factors but with morbid ruminations; may be classified as minimal risk based on the severity of the depressive symptoms  Discharge Diagnoses:   AXIS I:  Bipolar I disorder, most recent episode (or current) depressed, severe, without mention of psychotic behavior              Cocaine use disorder              Cannabis use disorder AXIS II:  Deferred AXIS III:   Past Medical History  Diagnosis Date  . Scoliosis   . Syphilis 2012, june    not clear he completed treatment.  non-compliant with follow up.  . Syphilis 08/09/2010  . HIV (human immunodeficiency virus infection)   . Gonorrhea in male   . Subungual hematoma of finger 03/23/2012  . 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 Tinea versicolor 10/08/2010  . Scabies 12/17/2011  . Boil 10/11/2012   AXIS IV:  economic problems, other psychosocial or environmental problems and problems with primary support group AXIS V:  61-70 mild symptoms  Plan Of Care/Follow-up recommendations:  Activity:  as tolerated Diet:  healthy Tests:   routine Other:  patient to keep his after care appointment  Is patient on multiple antipsychotic therapies at discharge:  No   Has Patient had three or more failed trials of  antipsychotic monotherapy by history:  No  Recommended Plan for Multiple Antipsychotic Therapies: NA  Thedore Mins, MD 03/01/2013, 10:25 AM

## 2013-03-01 NOTE — [facility] Suicide Risk Assessment (Signed)
[facility] INPATIENT:  Family/Significant Other Suicide Prevention Education  Suicide Prevention Education:  Education Completed; Shavonte, sister, 34 20 has been identified by the patient as the family member/significant other with whom the patient will be residing, and identified as the person(s) who will aid the patient in the event of a mental health crisis (suicidal ideations/suicide attempt).  With written consent from the patient, the family member/significant other has been provided the following suicide prevention education, prior to the and/or following the discharge of the patient.  The suicide prevention education provided includes the following:  Suicide risk factors  Suicide prevention and interventions  National Suicide Hotline telephone number  [facility] assessment telephone number  [name] Emergency Assistance 911  [facility] and/or Residential Mobile Crisis Unit telephone number  Request made of family/significant other to:  Remove weapons (e.g., guns, rifles, knives), all items previously/currently identified as safety concern.    Remove drugs/medications (over-the-counter, prescriptions, illicit drugs), all items previously/currently identified as a safety concern.  The family member/significant other verbalizes understanding of the suicide prevention education information provided.  The family member/significant other agrees to remove the items of safety concern listed above.  Daryel Gerald B 03/01/2013, 8:23 AM

## 2013-03-01 NOTE — Tx Team (Signed)
  Interdisciplinary Treatment Plan Update   Date Reviewed:  03/01/2013  Time Reviewed:  10:18 AM  Progress in Treatment:   Attending groups: Yes Participating in groups: Yes Taking medication as prescribed: Yes  Tolerating medication: Yes Family/Significant other contact made: Yes  Patient understands diagnosis: Yes  Discussing patient identified problems/goals with staff: Yes Medical problems stabilized or resolved: Yes Denies suicidal/homicidal ideation: Yes Patient has not harmed self or others: Yes  For review of initial/current patient goals, please see plan of care.  Estimated Length of Stay:  D/C today  Reason for Continuation of Hospitalization:   New Problems/Goals identified:  N/A  Discharge Plan or Barriers:   return home, follow up outpt  Additional Comments:  Attendees:  Signature: Thedore Mins, MD 03/01/2013 10:18 AM   Signature: Richelle Ito, LCSW 03/01/2013 10:18 AM  Signature: Fransisca Kaufmann, NP 03/01/2013 10:18 AM  Signature: Joslyn Devon, RN 03/01/2013 10:18 AM  Signature: Liborio Nixon, RN 03/01/2013 10:18 AM  Signature:  03/01/2013 10:18 AM  Signature:   03/01/2013 10:18 AM  Signature:    Signature:    Signature:    Signature:    Signature:    Signature:      Scribe for Treatment Team:   Richelle Ito, LCSW  03/01/2013 10:18 AM

## 2013-03-03 ENCOUNTER — Other Ambulatory Visit: Payer: Self-pay | Admitting: *Deleted

## 2013-03-03 DIAGNOSIS — B20 Human immunodeficiency virus [HIV] disease: Secondary | ICD-10-CM

## 2013-03-03 MED ORDER — ELVITEG-COBIC-EMTRICIT-TENOFDF 150-150-200-300 MG PO TABS: 1.0000 | ORAL_TABLET | Freq: Every day | ORAL | Status: DC

## 2013-03-04 NOTE — Discharge Summary (Signed)
Seen and agreed. Rishit Burkhalter, MD 

## 2013-03-04 NOTE — Progress Notes (Signed)
Patient Discharge Instructions:  After Visit Summary (AVS):   Faxed to:  03/04/13 Discharge Summary Note:   Faxed to:  03/04/13 Psychiatric Admission Assessment Note:   Faxed to:  03/04/13 Suicide Risk Assessment - Discharge Assessment:   Faxed to:  03/04/13 Faxed/Sent to the Next Level Care provider:  03/04/13 Faxed to [facility] @ [phone]  Jerelene Redden, 03/04/2013, 1:01 PM

## 2013-03-15 ENCOUNTER — Encounter: Payer: Self-pay | Admitting: Family Medicine

## 2013-03-15 ENCOUNTER — Ambulatory Visit (INDEPENDENT_AMBULATORY_CARE_PROVIDER_SITE_OTHER): Payer: Medicaid Other | Admitting: Family Medicine

## 2013-03-15 VITALS — BP 124/81 | HR 82 | Temp 98.8°F | Ht 74.0 in | Wt 148.0 lb

## 2013-03-15 DIAGNOSIS — K629 Disease of anus and rectum, unspecified: Secondary | ICD-10-CM

## 2013-03-15 DIAGNOSIS — M4120 Other idiopathic scoliosis, site unspecified: Secondary | ICD-10-CM

## 2013-03-15 DIAGNOSIS — K6289 Other specified diseases of anus and rectum: Secondary | ICD-10-CM

## 2013-03-15 DIAGNOSIS — K648 Other hemorrhoids: Secondary | ICD-10-CM

## 2013-03-15 NOTE — Patient Instructions (Signed)
For back pain  I am going to check with Dr. Berline Chough about sending you to a spine specialist or a chiropractor   For lesion around your butt  Check with the infectious disease doctors and see if they have any thoughts  If they do not, call Dr. Berline Chough and we will make a decision whether to send you to a general surgeon to have the area removed or to another doctor.   Thanks for your time today,  Dr. Durene Cal

## 2013-03-16 NOTE — Progress Notes (Signed)
Richard Conch, MD Phone: [phone]  Subjective:  Chief complaint-noted Same Day appointment  Patient presents today for follow up of his chronic low back pain/scoliosis s/p surgery (harrington rod placement) as well as for "itchy spots" near his anus that are not better.   Regarding back pain and scoliosis, Patient last seen by Dr. Berline Chough on 10.6. Plan at that time was for consideration of referral to pain management if no improvement, continued home exercises (medicaid so cannot get PT), start zanaflex as muscle relaxer. Had not been considered appropriate for OMT. Patient does not want to follow up with neurosurgery from [facility] and does not want surgery given that he "almost died" during last surgery. In addition, appears patient was discharged from pediatric orthopedist. Patient states he has "loose screws" and wants to be more comfortable. Today, describes diffuse back pain R>L. Worse just below his shoulder blade and just above his iliac crest. Patient states pain can be severe at times.   ROS-no fecal or urinary incontinence, no radiation to legs. Does states pain will be so severe sometimes that he falls down. He has to ask family to do things for him around the house such as cleaning bathtub.   Regarding rectal pain and itchy white spots near rectum, patient states he took the planned medications to help him have bowel movements but it didn't work. He states goes every day or every other day when previously had daily BM. He tends to strain. He does feel a bulge/hemorrhoid come out of his rectum occasionally. He has a prior external hemorrhoid which has a whitish color and another spot on his buttocks area that is hypopigmented. Both of these areas itch (this is his primary concern today). Patient was seen by ID on 12/11 and given clotrimazole cream. Diflucan had been tried previously without relief. Dr. Berline Chough had asked patient to have ID weigh on on what they thought the lesions may be.    ROS-no unintentional weight loss.   Past Medical History-bipolar 1, THC abuse, internal hemorrhoid, chronic back pain, constipation, HIV(well controlled) Medications- reviewed and updated Current Outpatient Prescriptions  Medication Sig Dispense Refill  . ARIPiprazole (ABILIFY MAINTENA) 400 MG SUSR Inject 400 mg into the muscle every 28 (twenty-eight) days.  1 each    . ARIPiprazole (ABILIFY) 5 MG tablet Take 1 tablet (5 mg total) by mouth at bedtime.  30 tablet  0  . elvitegravir-cobicistat-emtricitabine-tenofovir (STRIBILD) 150-150-200-300 MG TABS tablet Take 1 tablet by mouth daily with breakfast.  30 tablet  5  . escitalopram (LEXAPRO) 10 MG tablet Take 1 tablet (10 mg total) by mouth daily.  30 tablet  0  . gabapentin (NEURONTIN) 300 MG capsule Take 1 capsule (300 mg total) by mouth 2 (two) times daily.  60 capsule  0  . pantoprazole (PROTONIX) 40 MG tablet Take 1 tablet (40 mg total) by mouth every morning.  30 tablet  2  . traZODone (DESYREL) 50 MG tablet Take 1 tablet (50 mg total) by mouth at bedtime as needed and may repeat dose one time if needed for sleep.  60 tablet  0   No current facility-administered medications for this visit.   Objective: BP 124/81  Pulse 82  Temp(Src) 98.8 F (37.1 C) (Oral)  Ht 6\' 2"  (1.88 m)  Wt 148 lb (67.132 kg)  BMI 18.99 kg/m2 Gen: NAD, resting comfortably on chair CV: RRR no murmurs rubs or gallops Lungs: CTAB no crackles, wheeze, rhonchi Neuro: grossly normal, moves all extremities, 5/5 strength in  lower extremities, no saddle anesthsia Back: tender to palpation just below scapula and just above iliac crest (moderately tender) GU: external rectal exam: hypopigmented macule similar in size to previous at 0.7cm near rectum. Also has 2 small circular macules that are hypopigmented at 8 o clock and 5-10cm out from rectum.   Assessment/Plan:  SCOLIOSIS Patient presents for same day to discuss chronic issue. Discussed with patient I would  prefer to discuss with PCP options: 1. Dr. Berline Chough didn't think OMT would be beneficial but would like to discuss if he thinks chiropractor or accupunture would be of benefit 2. Should we have patient establish with adult neurosurgeon 3. Should we refer to pain management (did not think this referral would be appropriate from me either)  Internal hemorrhoid Not using anything to help with regular bowel movement or straining, encouraged patient to try miralax OTC and can use up to 2 capfuls a day to have daily BM>   Anal lesion Patient did not discuss with ID (but did show ID some light colored hairs on scrotum). Patient was encouraged to discuss the anal lesion to see if ID can give any insight. Once patient has done this, he was instructed to call our office with plan, we would be happy to refer to colorectal or general surgeon to get tissue sample. Agree with previous assessment that anal pap unlikely beneficial. Now with multiple hypopigmented areas (? Vitiligo but atypical distribution and doubt this is the cause). Steroids have not been beneficial.

## 2013-03-16 NOTE — Assessment & Plan Note (Signed)
Patient did not discuss with ID (but did show ID some light colored hairs on scrotum). Patient was encouraged to discuss the anal lesion to see if ID can give any insight. Once patient has done this, he was instructed to call our office with plan, we would be happy to refer to colorectal or general surgeon to get tissue sample. Agree with previous assessment that anal pap unlikely beneficial. Now with multiple hypopigmented areas (? Vitiligo but atypical distribution and doubt this is the cause). Steroids have not been beneficial.

## 2013-03-16 NOTE — Assessment & Plan Note (Signed)
Patient presents for same day to discuss chronic issue. Discussed with patient I would prefer to discuss with PCP options: 1. Dr. Berline Chough didn't think OMT would be beneficial but would like to discuss if he thinks chiropractor or accupunture would be of benefit 2. Should we have patient establish with adult neurosurgeon 3. Should we refer to pain management (did not think this referral would be appropriate from me either)

## 2013-03-16 NOTE — Assessment & Plan Note (Signed)
Not using anything to help with regular bowel movement or straining, encouraged patient to try miralax OTC and can use up to 2 capfuls a day to have daily BM>

## 2013-03-21 ENCOUNTER — Ambulatory Visit: Payer: Self-pay | Admitting: Infectious Disease

## 2013-03-22 ENCOUNTER — Other Ambulatory Visit: Payer: Self-pay | Admitting: *Deleted

## 2013-03-22 DIAGNOSIS — B20 Human immunodeficiency virus [HIV] disease: Secondary | ICD-10-CM

## 2013-03-22 MED ORDER — ELVITEG-COBIC-EMTRICIT-TENOFDF 150-150-200-300 MG PO TABS: 1.0000 | ORAL_TABLET | Freq: Every day | ORAL | Status: DC

## 2013-03-23 ENCOUNTER — Other Ambulatory Visit (HOSPITAL_COMMUNITY)
Admission: RE | Admit: 2013-03-23 | Discharge: 2013-03-23 | Disposition: A | Discharge status: Home / Self Care | Payer: Medicaid Other | Source: Ambulatory Visit | Attending: Infectious Disease | Admitting: Infectious Disease

## 2013-03-23 ENCOUNTER — Ambulatory Visit (INDEPENDENT_AMBULATORY_CARE_PROVIDER_SITE_OTHER): Payer: Medicaid Other | Admitting: Infectious Disease

## 2013-03-23 ENCOUNTER — Encounter: Payer: Self-pay | Admitting: Infectious Disease

## 2013-03-23 VITALS — BP 114/71 | HR 81 | Temp 98.7°F | Wt 146.0 lb

## 2013-03-23 DIAGNOSIS — Z113 Encounter for screening for infections with a predominantly sexual mode of transmission: Secondary | ICD-10-CM | POA: Insufficient documentation

## 2013-03-23 DIAGNOSIS — B20 Human immunodeficiency virus [HIV] disease: Secondary | ICD-10-CM

## 2013-03-23 DIAGNOSIS — A630 Anogenital (venereal) warts: Secondary | ICD-10-CM

## 2013-03-23 DIAGNOSIS — L290 Pruritus ani: Secondary | ICD-10-CM

## 2013-03-23 DIAGNOSIS — F314 Bipolar disorder, current episode depressed, severe, without psychotic features: Secondary | ICD-10-CM

## 2013-03-23 DIAGNOSIS — K648 Other hemorrhoids: Secondary | ICD-10-CM

## 2013-03-23 DIAGNOSIS — Z21 Asymptomatic human immunodeficiency virus [HIV] infection status: Secondary | ICD-10-CM

## 2013-03-23 LAB — CBC WITH DIFFERENTIAL/PLATELET
Basophils Absolute: 0 10*3/uL (ref 0.0–0.1)
Basophils Relative: 0 % (ref 0–1)
Eosinophils Absolute: 0.1 10*3/uL (ref 0.0–0.7)
Eosinophils Relative: 2 % (ref 0–5)
HCT: 44.1 % (ref 39.0–52.0)
Hemoglobin: 15 g/dL (ref 13.0–17.0)
LYMPHS PCT: 40 % (ref 12–46)
Lymphs Abs: 1.8 10*3/uL (ref 0.7–4.0)
MCH: 32.3 pg (ref 26.0–34.0)
MCHC: 34 g/dL (ref 30.0–36.0)
MCV: 94.8 fL (ref 78.0–100.0)
Monocytes Absolute: 0.5 10*3/uL (ref 0.1–1.0)
Monocytes Relative: 10 % (ref 3–12)
NEUTROS ABS: 2.1 10*3/uL (ref 1.7–7.7)
NEUTROS PCT: 48 % (ref 43–77)
PLATELETS: 199 10*3/uL (ref 150–400)
RBC: 4.65 MIL/uL (ref 4.22–5.81)
RDW: 14.1 % (ref 11.5–15.5)
WBC: 4.6 10*3/uL (ref 4.0–10.5)

## 2013-03-23 LAB — COMPLETE METABOLIC PANEL WITH GFR
ALT: 14 U/L (ref 0–53)
AST: 16 U/L (ref 0–37)
Albumin: 4.5 g/dL (ref 3.5–5.2)
Alkaline Phosphatase: 84 U/L (ref 39–117)
BUN: 14 mg/dL (ref 6–23)
CALCIUM: 9.4 mg/dL (ref 8.4–10.5)
CO2: 28 meq/L (ref 19–32)
CREATININE: 0.84 mg/dL (ref 0.50–1.35)
Chloride: 103 mEq/L (ref 96–112)
GFR, Est Non African American: >89 mL/min
Glucose, Bld: 88 mg/dL (ref 70–99)
Potassium: 4.8 mEq/L (ref 3.5–5.3)
Sodium: 138 mEq/L (ref 135–145)
Total Bilirubin: 0.4 mg/dL (ref 0.3–1.2)
Total Protein: 7 g/dL (ref 6.0–8.3)

## 2013-03-23 NOTE — Patient Instructions (Signed)
Please make appt with Dr. Ninetta LightsHatcher in the [facility]  RTC in 2 months for regular followup  ENSURE ADAP RENEWAL!

## 2013-03-23 NOTE — Progress Notes (Signed)
Subjective:    Patient ID: Richard Rojas, male    DOB: [birth year], 23 y.o.   MRN: [id number]  HPI   Richard Rojas is a 23 y.o. male who had been doing superbly well on his  antiviral regimen, STRIBILD with undetectable viral load and health cd4 count when checked in July, 2014 but no labs since then  When I last saw him he was still having massive troubles with his with bipolar with active homicidal ideation with desire to kill his mother. [facility] and he is doing much better now and back on meds.   [facility] and has been suffering from anal pruritis and was given hydrocortisone suppositories but he dislikes them and then when they were stopped he had return of pruritis.   Skin and hair color changes on scrotum have resolved.  Review of Systems  Constitutional: Negative for fever, chills, diaphoresis, activity change, appetite change, fatigue and unexpected weight change.  HENT: Negative for congestion, rhinorrhea, sinus pressure, sneezing, sore throat and trouble swallowing.   Eyes: Negative for photophobia and visual disturbance.  Respiratory: Negative for cough, chest tightness, shortness of breath, wheezing and stridor.   Cardiovascular: Negative for chest pain, palpitations and leg swelling.  Gastrointestinal: Negative for nausea, vomiting, abdominal pain, diarrhea, constipation, blood in stool, abdominal distention and anal bleeding.  Genitourinary: Negative for dysuria, hematuria, flank pain and difficulty urinating.  Musculoskeletal: Negative for arthralgias, back pain, gait problem, joint swelling and myalgias.  Skin: Negative for color change, pallor, rash and wound.  Neurological: Negative for dizziness, tremors, weakness and light-headedness.  Hematological: Negative for adenopathy. Does not bruise/bleed easily.  Psychiatric/Behavioral: Positive for dysphoric mood. Negative for suicidal ideas, hallucinations, behavioral problems,  confusion, sleep disturbance, self-injury, decreased concentration and agitation. The patient is nervous/anxious. The patient is not hyperactive.        Objective:   Physical Exam  Constitutional: He is oriented to person, place, and time. He appears well-developed and well-nourished. No distress.  HENT:  Head: Normocephalic and atraumatic.  Mouth/Throat: Oropharynx is clear and moist.  Eyes: Conjunctivae and EOM are normal.  Neck: Normal range of motion. Neck supple.  Cardiovascular: Normal rate and regular rhythm.   Pulmonary/Chest: Effort normal. No respiratory distress. He has no wheezes.  Abdominal: He exhibits no distension.  Genitourinary:        Musculoskeletal: He exhibits no edema and no tenderness.  Neurological: He is alert and oriented to person, place, and time. He has normal reflexes. He exhibits normal muscle tone. Coordination normal.  Skin: Skin is warm and dry. He is not diaphoretic. No erythema. No pallor.  Psychiatric: His behavior is normal. Judgment and thought content normal. His mood appears anxious.          Assessment & Plan:  HIV: excellent control continue STRIBILD in the past, check labs  I spent greater than 25minutes with the patient including greater than 50% of time in face to face counsel of the patient and in coordination of their care  Bipolar disorder with homicidal ideation:   Hair color change on scrotum\: resolved  Anal pruritis: try otc hydrocortisone cream to external area  ? Internal hemorrhoid: will refer him to my partner Dr. Ninetta LightsHatcher for HRA, also helpful for him to have this given anal warts  .   HIV: This is very well controlled he does have daily nausea which he states he had before he was placed on  STRIBILD. I would consider changing him to [facility] possibly

## 2013-03-24 LAB — RPR TITER

## 2013-03-24 LAB — MICROALBUMIN / CREATININE URINE RATIO
CREATININE, URINE: 134.1 mg/dL
Microalb Creat Ratio: 16.7 mg/g (ref 0.0–30.0)
Microalb, Ur: 2.24 mg/dL — ABNORMAL HIGH (ref 0.00–1.89)

## 2013-03-24 LAB — T.PALLIDUM AB, TOTAL

## 2013-03-24 LAB — RPR: RPR: REACTIVE — AB

## 2013-03-25 LAB — HIV-1 RNA QUANT-NO REFLEX-BLD: HIV-1 RNA Quant, Log: <1.3 {Log} (ref <1.30)

## 2013-03-26 LAB — HLA B*5701: HLA-B*5701 w/rflx HLA-B High: NEGATIVE

## 2013-03-27 LAB — VITAMIN D PNL(25-HYDRXY+1,25-DIHY)-BLD
VITAMIN D 1, 25 (OH) TOTAL: 67 pg/mL (ref 18–72)
VITAMIN D2 1, 25 (OH): 9 pg/mL
Vit D, 25-Hydroxy: 13 ng/mL — ABNORMAL LOW (ref 30–89)
Vitamin D3 1, 25 (OH)2: 58 pg/mL

## 2013-03-28 LAB — T-HELPER CELL (CD4) - (RCID CLINIC ONLY)
CD4 % Helper T Cell: 50 % (ref 33–55)
CD4 T Cell Abs: 1000 /uL (ref 400–2700)

## 2013-04-08 ENCOUNTER — Other Ambulatory Visit: Payer: Self-pay | Admitting: Infectious Disease

## 2013-04-08 ENCOUNTER — Telehealth: Payer: Self-pay | Admitting: *Deleted

## 2013-04-08 DIAGNOSIS — R369 Urethral discharge, unspecified: Secondary | ICD-10-CM

## 2013-04-08 NOTE — Telephone Encounter (Signed)
Patient called c/o penile discharge. 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 Chlamydia and Gonorrhea were negative at that time. Please advise Richard Rojas

## 2013-04-08 NOTE — Telephone Encounter (Signed)
Patient will come on Monday 04/11/13. Added to lab schedule and test ordered. Richard Rojas

## 2013-04-08 NOTE — Telephone Encounter (Signed)
Lets retest for GC and chlamydia and retreat with 250mg  rocephin IM and and 1 gram azithromycin

## 2013-04-11 ENCOUNTER — Other Ambulatory Visit (INDEPENDENT_AMBULATORY_CARE_PROVIDER_SITE_OTHER): Payer: Self-pay

## 2013-04-11 ENCOUNTER — Other Ambulatory Visit (HOSPITAL_COMMUNITY)
Admission: RE | Admit: 2013-04-11 | Discharge: 2013-04-11 | Disposition: A | Discharge status: Home / Self Care | Payer: Medicaid Other | Source: Ambulatory Visit | Attending: Infectious Disease | Admitting: Infectious Disease

## 2013-04-11 ENCOUNTER — Ambulatory Visit (INDEPENDENT_AMBULATORY_CARE_PROVIDER_SITE_OTHER): Payer: Medicaid Other | Admitting: *Deleted

## 2013-04-11 DIAGNOSIS — B20 Human immunodeficiency virus [HIV] disease: Secondary | ICD-10-CM

## 2013-04-11 DIAGNOSIS — R369 Urethral discharge, unspecified: Secondary | ICD-10-CM

## 2013-04-11 DIAGNOSIS — Z113 Encounter for screening for infections with a predominantly sexual mode of transmission: Secondary | ICD-10-CM | POA: Insufficient documentation

## 2013-04-11 DIAGNOSIS — A64 Unspecified sexually transmitted disease: Secondary | ICD-10-CM

## 2013-04-11 MED ORDER — AZITHROMYCIN 250 MG PO TABS
1000.0000 mg | ORAL_TABLET | Freq: Once | ORAL | Status: AC
  Administered 2013-04-11: 1000 mg via ORAL

## 2013-04-11 MED ORDER — AZITHROMYCIN 250 MG PO TABS: 1000.0000 mg | ORAL_TABLET | Freq: Every day | ORAL | Status: DC

## 2013-04-11 MED ORDER — LIDOCAINE HCL 1 % IJ SOLN
250.0000 mg | Freq: Once | INTRAMUSCULAR | Status: AC
Start: 2013-04-11 — End: 2013-04-11
  Administered 2013-04-11: 250 mg via INTRAMUSCULAR

## 2013-04-11 NOTE — Progress Notes (Signed)
Pt without complaints after IM injection.  Pt given 3 dozen condoms and a handbook discussing STIs.

## 2013-04-14 ENCOUNTER — Telehealth: Payer: Self-pay | Admitting: Licensed Clinical Social Worker

## 2013-04-14 ENCOUNTER — Other Ambulatory Visit: Payer: Self-pay | Admitting: Licensed Clinical Social Worker

## 2013-04-14 DIAGNOSIS — K219 Gastro-esophageal reflux disease without esophagitis: Secondary | ICD-10-CM

## 2013-04-14 MED ORDER — OMEPRAZOLE 20 MG PO CPDR: 20.0000 mg | DELAYED_RELEASE_CAPSULE | Freq: Every day | ORAL | Status: AC

## 2013-04-14 NOTE — Telephone Encounter (Signed)
Protonix is not covered under ADAP can this patient be changed to Prilosec maybe?

## 2013-04-14 NOTE — Telephone Encounter (Signed)
Yes

## 2013-05-19 ENCOUNTER — Other Ambulatory Visit: Payer: Self-pay

## 2013-05-23 ENCOUNTER — Telehealth: Payer: Self-pay | Admitting: *Deleted

## 2013-05-23 NOTE — Telephone Encounter (Signed)
Patient called inquiring about getting documentation regarding any restrictions or limitations he may have due to his medications.  Pt states he needs this for his lawyers.  RN reminded patient that he has an appointment 3/19 and could speak with Dr. Daiva EvesVan Dam about his HIV medications during that time.  Patient encouraged to keep his PCP appointment 3/10 to discuss any limitations/restrictions with non-042 medications.  Pt agreed.  Pt scheduled for labs tomorrow as he missed his lab appointment last week. Andree CossHowell, Eman Rynders M, RN

## 2013-05-24 ENCOUNTER — Ambulatory Visit (INDEPENDENT_AMBULATORY_CARE_PROVIDER_SITE_OTHER): Payer: Medicaid Other | Admitting: Family Medicine

## 2013-05-24 ENCOUNTER — Encounter: Payer: Self-pay | Admitting: Family Medicine

## 2013-05-24 ENCOUNTER — Other Ambulatory Visit: Payer: Self-pay

## 2013-05-24 VITALS — BP 119/49 | HR 103 | Temp 98.9°F | Ht 74.0 in | Wt 155.0 lb

## 2013-05-24 DIAGNOSIS — G8929 Other chronic pain: Secondary | ICD-10-CM

## 2013-05-24 DIAGNOSIS — M549 Dorsalgia, unspecified: Secondary | ICD-10-CM

## 2013-05-24 MED ORDER — GABAPENTIN 300 MG PO CAPS: 600.0000 mg | ORAL_CAPSULE | Freq: Three times a day (TID) | ORAL | Status: AC

## 2013-05-24 NOTE — Patient Instructions (Signed)
It was nice to see you today.  I have increased your Gabapentin to aid with pain.  Please contact your lawyer and have the forms/documents brought or faxed to Dr. Berline Choughigby.  Please follow up with Dr. Berline Choughigby as soon as he is available.

## 2013-05-24 NOTE — Progress Notes (Signed)
   Subjective:    Patient ID: Richard Rojas, male    DOB: 02-26-1991, 23 y.o.   MRN: [id number]  HPI 23 year old male with history of chronic back pain and scoliosis presents for a same day appointment regarding back pain.  1) Back pain - Patient has long-standing back pain. - He has had prior Herrington rods for scoliosis with loosening hardware - No recent trauma/fall or injury - Pain is located diffusely throughout his back - He takes Gabapentin (300 mg BID) with little relief.  He states that he has become accustomed to the pain and states that it is tolerable. - His primary concern today is "paperwork" regarding restrictions/limitations that he states is needed by his lawyers regarding disability.  Review of Systems Per HPI    Objective:   Physical Exam Filed Vitals:   05/24/13 1411  BP: 119/49  Pulse: 103  Temp: 98.9 F (37.2 C)   Exam: General: appears in NAD.  Back: Large midline scar spanning nearly the entire spine.  No tenderness to palpation.  No redness or erythema. Psych: flat affect, minimal expression.  Not very conversant and very reserved.     Assessment & Plan:  See Problem List

## 2013-05-24 NOTE — Assessment & Plan Note (Signed)
Gabapentin increased today. Patient instructed to obtain paperwork from lawyers and have this brought in for PCP to address.

## 2013-06-02 ENCOUNTER — Ambulatory Visit: Payer: Self-pay | Admitting: Infectious Disease

## 2013-06-06 ENCOUNTER — Encounter: Payer: Self-pay | Admitting: Sports Medicine

## 2013-06-06 ENCOUNTER — Ambulatory Visit (INDEPENDENT_AMBULATORY_CARE_PROVIDER_SITE_OTHER): Payer: Medicaid Other | Admitting: Sports Medicine

## 2013-06-06 VITALS — BP 121/68 | HR 75 | Ht 74.0 in | Wt 157.0 lb

## 2013-06-06 DIAGNOSIS — F314 Bipolar disorder, current episode depressed, severe, without psychotic features: Secondary | ICD-10-CM

## 2013-06-06 DIAGNOSIS — F322 Major depressive disorder, single episode, severe without psychotic features: Secondary | ICD-10-CM

## 2013-06-06 DIAGNOSIS — M549 Dorsalgia, unspecified: Secondary | ICD-10-CM

## 2013-06-06 DIAGNOSIS — F329 Major depressive disorder, single episode, unspecified: Secondary | ICD-10-CM

## 2013-06-06 DIAGNOSIS — F3289 Other specified depressive episodes: Secondary | ICD-10-CM

## 2013-06-06 DIAGNOSIS — G8929 Other chronic pain: Secondary | ICD-10-CM

## 2013-06-06 MED ORDER — DULOXETINE HCL 30 MG PO CPEP: 30.0000 mg | ORAL_CAPSULE | Freq: Every day | ORAL | Status: AC

## 2013-06-06 NOTE — Progress Notes (Signed)
YVETTE ROARK - 23 y.o. male MRN [id number]  Date of birth: [birth year]  SUBJECTIVE:     CC: Back Pain and Medical Managment of Chronic Issues See problem based charting for additional subjective (including HPI, Interval History & ROS)   HISTORY: Willem's major active medical problems include: # HIV: Followed by infectious disease actively - Prior other infections including  scabies, boils, syphilis # Chronic back pain: Status post Herrington rods for scoliosis with loosening hardware Never underwent formal physical therapy, most recently seen at [facility] in early 2014.  See overview  Other Pertinent Med/Surg/Hosp History: # Mood disorder and suicidal ideation  # Prior suppurative hydradenitis    Recent Labs  08/05/12 1144  TRIG 111  CHOL 143  HDL 34*  LDLCALC 87  } Wt Readings from Last 3 Encounters:  06/06/13 157 lb (71.215 kg)  05/24/13 155 lb (70.308 kg)  03/23/13 146 lb (66.225 kg)   BP Readings from Last 3 Encounters:  06/06/13 121/68  05/24/13 119/49  03/23/13 114/71    History  Smoking status  . Current Some Day Smoker -- 0.30 packs/day for 2 years  . Types: Cigarettes  . Last Attempt to Quit: 11/12/2011  Smokeless tobacco  . Never Used   No health maintenance topics applied.  Otherwise past Medical, Surgical, Social, and Family History Reviewed per EMR Medications and Allergies reviewed and updated per below.  VITALS: BP 121/68  Pulse 75  Ht 6\' 2"  (1.88 m)  Wt 157 lb (71.215 kg)  BMI 20.15 kg/m2  PHYSICAL EXAM: GENERAL:  Young adult [place] American male. In no discomfort; no respiratory distress  PSYCH: alert and interactive however withdrawn, poor insight  Denies current suicidal ideation, homicidal ideation  Overall denies significant depression of his affect is depressed ,   BASCK  back Exam: Appear:  right-sided lumbar prominence with significant scoliosis  Overall poor posture, possibly since and slumped and curled position.    Palp:   significant muscle spasms bilaterally in Herrington rod placement   ROM:  limited lumbar range of motion,   NV:   The bilateral Lower Extremity myotomes are 5+/5.  Sensation is grossly intact to light sensation in bilateral Lower Extremity dermatomes.  Testing:  negative straight leg raise bilaterally  Negative logroll, negative Faber     SKIN:     MEDICATIONS, LABS & OTHER ORDERS: Previous Medications   ARIPIPRAZOLE (ABILIFY MAINTENA) 400 MG SUSR    Inject 400 mg into the muscle every 28 (twenty-eight) days.   ARIPIPRAZOLE (ABILIFY) 5 MG TABLET    Take 1 tablet (5 mg total) by mouth at bedtime.   ELVITEGRAVIR-COBICISTAT-EMTRICITABINE-TENOFOVIR (STRIBILD) 150-150-200-300 MG TABS TABLET    Take 1 tablet by mouth daily with breakfast.   ESCITALOPRAM (LEXAPRO) 10 MG TABLET    Take 1 tablet (10 mg total) by mouth daily.   GABAPENTIN (NEURONTIN) 300 MG CAPSULE    Take 2 capsules (600 mg total) by mouth 3 (three) times daily.   OMEPRAZOLE (PRILOSEC) 20 MG CAPSULE    Take 1 capsule (20 mg total) by mouth daily.   TRAZODONE (DESYREL) 50 MG TABLET    Take 1 tablet (50 mg total) by mouth at bedtime as needed and may repeat dose one time if needed for sleep.   Modified Medications   No medications on file   New Prescriptions   DULOXETINE (CYMBALTA) 30 MG CAPSULE    Take 1 capsule (30 mg total) by mouth daily.   Discontinued Medications   No medications  on file   Orders Placed This Encounter  Procedures  . DG Lumbar Spine Complete  . Ambulatory referral to Physical Therapy   ASSESSMENT & PLAN: See problem based charting & AVS for pt instructions.

## 2013-06-06 NOTE — Patient Instructions (Addendum)
[facility].  I am referring you to physical therapy  for a functional assessment.  If you have not heard from them in the next 1 week please call us back  We're starting you on Cymbalta to help with your pain.

## 2013-06-13 ENCOUNTER — Ambulatory Visit (HOSPITAL_COMMUNITY): Admission: RE | Admit: 2013-06-13 | Payer: Medicaid Other | Source: Ambulatory Visit

## 2013-06-13 NOTE — Assessment & Plan Note (Signed)
Problem Based Documentation:    Subjective Report:  Reports continued issues with mood but denies SI/HI  On injectable Abilify     Assessment & Plan & Follow up Issues:  Chronic, moderately controlled condition - Seems overall improved since his hospitalization and January - Continue to be followed by psychology on regular basis  1. Start Cymbalta for pain modulation given stability on injectable therapy, cautioned for precipitating manic episode > Follow up mood in likely interaction between it and chronic pain syndrome.

## 2013-06-13 NOTE — Assessment & Plan Note (Addendum)
Problem Based Documentation:    Subjective Report:  Worsening right SI region pain since having a fall and landing on the bathtub approximately 3 weeks ago.  Reports persistent, nonradiating pain since that time  Pt denies any radicular symptoms, change in bowel or bladder habits, muscle weakness associated with back pain.  No fevers, chills, night sweats or weight loss.  Has not been doing any specific him exercises, no routine medications.    Not interested in surgery, does not want to see neurosurgery  He is interested in applying for disability and needs a functional assessment completed     Assessment & Plan & Follow up Issues:  Chronic, difficult and poorly controlled condition - [facility] Harrington rods but has been recommended followup with local spine doctor. - Reported previous hardware loosing by pt but last Ortho Note in Care Everywhere happy with placement 1. Reevaluate hardware placement following fall with repeat x-rays >50% of this 25 minute visit spent in direct patient counseling and/or coordination of care around the options we have for his back and ultimately continued conservative treatment and likely referral to pain management as appropriate however he is essentially not agreeable to any intervention at this time Referral to physical therapy for functional assessment Attempt for improved pain modulation with Cymbalta.  See bipolar section > Medication compliance and functioning status. .Marland Kitchen

## 2013-06-13 NOTE — Assessment & Plan Note (Addendum)
No evidence today for SI but does report it in the past.  I suspect this is a large contribution to underlying medical conditions including his chronic pain syndrome. Discussed strategies/importance for improving daily structure including, food, exercise and sleep.

## 2013-06-24 ENCOUNTER — Telehealth: Payer: Self-pay | Admitting: *Deleted

## 2013-06-24 NOTE — Telephone Encounter (Signed)
Patient called c/o penile discharge and said he does not have sex anymore. Asked if he had oral sex and he said, "not really", last time was one month ago. Advised him it is possible that he has an STD and [facility] as there are no available appts today. Patient agreed to this.

## 2013-06-27 ENCOUNTER — Ambulatory Visit (INDEPENDENT_AMBULATORY_CARE_PROVIDER_SITE_OTHER): Payer: Medicaid Other | Admitting: Infectious Diseases

## 2013-06-27 ENCOUNTER — Ambulatory Visit: Payer: Medicaid Other | Attending: Sports Medicine

## 2013-06-27 ENCOUNTER — Other Ambulatory Visit: Payer: Medicaid Other

## 2013-06-27 ENCOUNTER — Encounter: Payer: Self-pay | Admitting: Infectious Diseases

## 2013-06-27 ENCOUNTER — Other Ambulatory Visit: Payer: Self-pay | Admitting: Licensed Clinical Social Worker

## 2013-06-27 ENCOUNTER — Other Ambulatory Visit (HOSPITAL_COMMUNITY)
Admission: RE | Admit: 2013-06-27 | Discharge: 2013-06-27 | Disposition: A | Discharge status: Home / Self Care | Payer: Medicaid Other | Source: Ambulatory Visit | Attending: Infectious Diseases | Admitting: Infectious Diseases

## 2013-06-27 VITALS — BP 107/67 | HR 72 | Temp 98.4°F | Ht 74.0 in | Wt 152.0 lb

## 2013-06-27 DIAGNOSIS — R369 Urethral discharge, unspecified: Secondary | ICD-10-CM | POA: Insufficient documentation

## 2013-06-27 DIAGNOSIS — F17200 Nicotine dependence, unspecified, uncomplicated: Secondary | ICD-10-CM

## 2013-06-27 DIAGNOSIS — Z113 Encounter for screening for infections with a predominantly sexual mode of transmission: Secondary | ICD-10-CM | POA: Insufficient documentation

## 2013-06-27 DIAGNOSIS — Z21 Asymptomatic human immunodeficiency virus [HIV] infection status: Secondary | ICD-10-CM

## 2013-06-27 DIAGNOSIS — F314 Bipolar disorder, current episode depressed, severe, without psychotic features: Secondary | ICD-10-CM

## 2013-06-27 DIAGNOSIS — B20 Human immunodeficiency virus [HIV] disease: Secondary | ICD-10-CM

## 2013-06-27 MED ORDER — CEFTRIAXONE SODIUM 1 G IJ SOLR
250.0000 mg | Freq: Once | INTRAMUSCULAR | Status: AC
  Administered 2013-06-27: 250 mg via INTRAMUSCULAR

## 2013-06-27 MED ORDER — CEFTRIAXONE SODIUM 250 MG IJ SOLR: 250.0000 mg | Freq: Once | INTRAMUSCULAR | Status: DC

## 2013-06-27 MED ORDER — AZITHROMYCIN 250 MG PO TABS
1000.0000 mg | ORAL_TABLET | Freq: Once | ORAL | Status: AC
  Administered 2013-06-27: 1000 mg via ORAL

## 2013-06-27 MED ORDER — AZITHROMYCIN 250 MG PO TABS
ORAL_TABLET | ORAL | Status: DC
Start: 2013-06-27 — End: 2013-09-12

## 2013-06-27 NOTE — Assessment & Plan Note (Signed)
[facility] f[facility]. Greatly appreciate their partnering with us.

## 2013-06-27 NOTE — Progress Notes (Signed)
   Subjective:    Patient ID: Richard Rojas, male    DOB: [birth year], 23 y.o.   MRN: [id number]  Penile Discharge The patient's primary symptoms include penile discharge. Pertinent negatives include no constipation or diarrhea.   23 yo M with hx of HIV+, bipolar disease, scoliosis/harrington rods. On stribild.  Has been having penile d/c and burning for the last 4 days. No penile sore. Has new partner "kinda, sorta." No missed ART.  Has been feeling "alright, a little bit of depression"  HIV 1 RNA Quant (copies/mL)  Date Value  03/23/2013 <20   08/05/2012 <20   03/23/2012 <20      CD4 T Cell Abs (/uL)  Date Value  03/23/2013 1000   08/05/2012 890   03/23/2012 850      Review of Systems  Constitutional: Negative for appetite change and unexpected weight change.  Gastrointestinal: Negative for diarrhea and constipation.  Genitourinary: Positive for discharge.  Psychiatric/Behavioral: Positive for dysphoric mood.       Objective:   Physical Exam  Constitutional: He appears well-developed and well-nourished.  Eyes: EOM are normal. Pupils are equal, round, and reactive to light.  Neck: Neck supple.  Cardiovascular: Normal rate, regular rhythm and normal heart sounds.   Pulmonary/Chest: Effort normal and breath sounds normal.  Abdominal: Soft. Bowel sounds are normal. He exhibits no distension. There is no tenderness.  Lymphadenopathy:    He has no cervical adenopathy.          Assessment & Plan:

## 2013-06-27 NOTE — Addendum Note (Signed)
Addended by: Wendall MolaOCKERHAM, JACQUELINE A on: 06/27/2013 03:43 PM   Modules accepted: Orders

## 2013-06-27 NOTE — Assessment & Plan Note (Addendum)
Doing well on his current ART. Has f/u with Dr Daiva EvesVan Dam in may. He is Hep B immune, Hep A vaccinated.

## 2013-06-27 NOTE — Assessment & Plan Note (Signed)
Encouraged him to quit. [facility].

## 2013-06-27 NOTE — Assessment & Plan Note (Signed)
Will check his for syphillis, GC, chlamydia. Treat him empirically for gc/chlamydia.

## 2013-06-28 LAB — RPR

## 2013-06-28 LAB — URINE CYTOLOGY ANCILLARY ONLY
CHLAMYDIA, DNA PROBE: NEGATIVE
NEISSERIA GONORRHEA: POSITIVE — AB

## 2013-07-11 ENCOUNTER — Other Ambulatory Visit: Payer: Self-pay

## 2013-07-19 ENCOUNTER — Telehealth: Payer: Self-pay | Admitting: *Deleted

## 2013-07-19 NOTE — Telephone Encounter (Signed)
Ann, Pharmacist with [facility] call to confirm if pt is on both duloxetine and escitalopasm.  Nurse informed Dewayne Hatchnn that pt was started on duloxetine for pain management on 06/06/2013 by PCP.  Clovis Puamika L Heidy Mccubbin, RN

## 2013-07-25 ENCOUNTER — Ambulatory Visit: Payer: Self-pay | Admitting: Infectious Disease

## 2013-07-30 IMAGING — CT CT HEAD WO/W CM
1 of 2 series · 13 of 30 positions shown, 17 images · IV contrast (agent unspecified)
Comparison: None.

CLINICAL DATA: Headache, fever and mycosis.

CT HEAD WITHOUT AND WITH CONTRAST
TECHNIQUE: Contiguous axial images were obtained from the base of
the skull through the vertex without and with intravenous contrast.
Contrast:  80 ml Smnipaque-ECC

[Series 2: brain · axial · 0.47mm/px · z∈[+97,+227]mm · 13 of 36 slices shown, 17 images]
[im 3/36  brain]
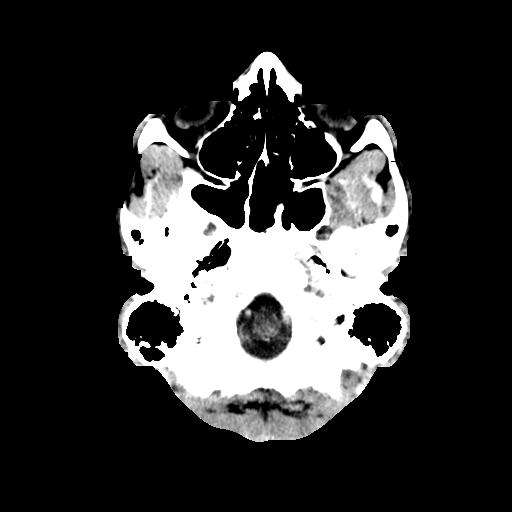
[im 3/36  bone]
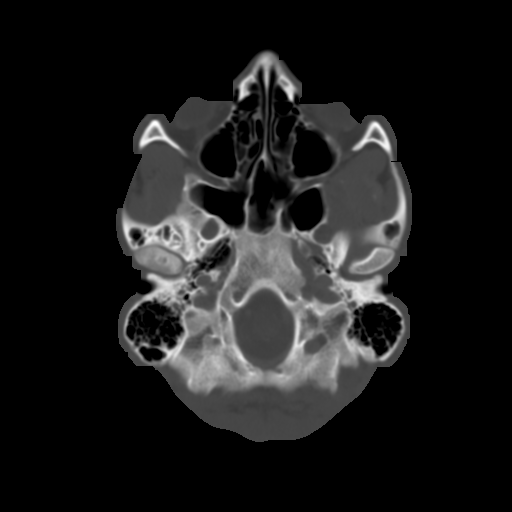
[im 6/36  brain]
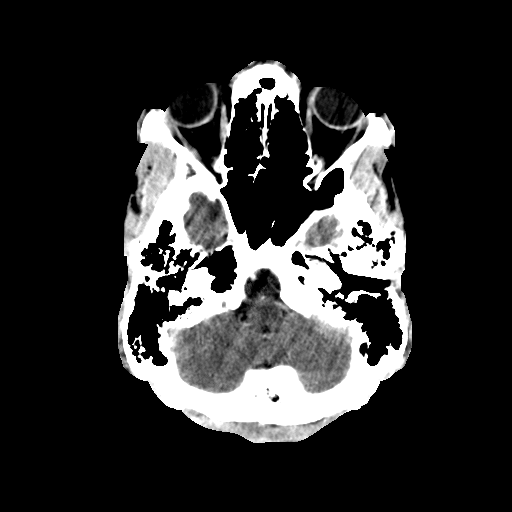
[im 8/36  brain]
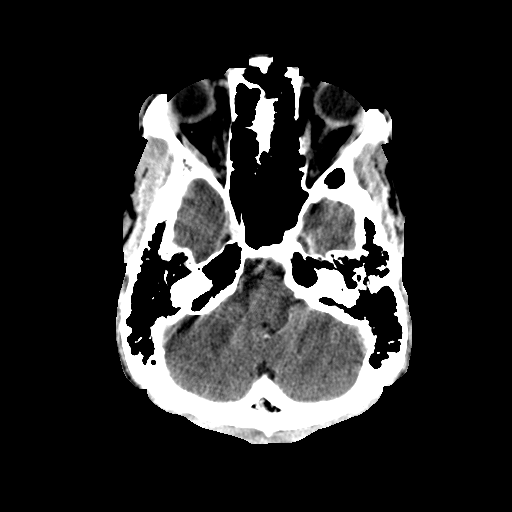
[im 11/36  brain]
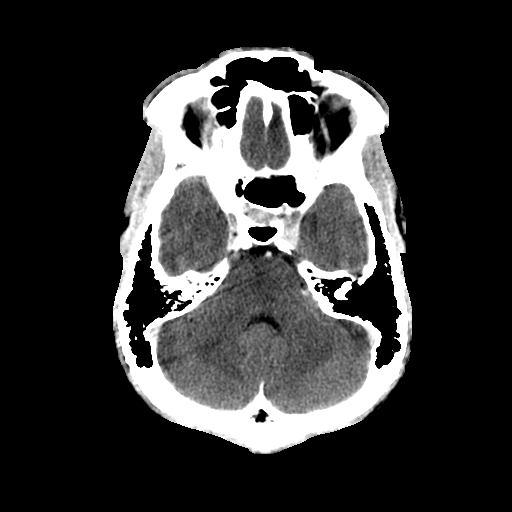
[im 13/36  brain]
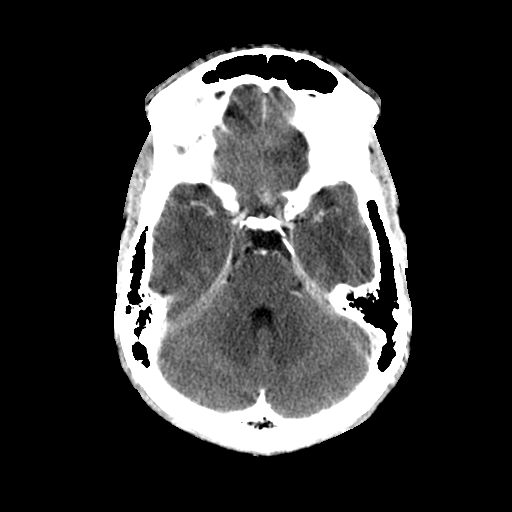
[im 13/36  bone]
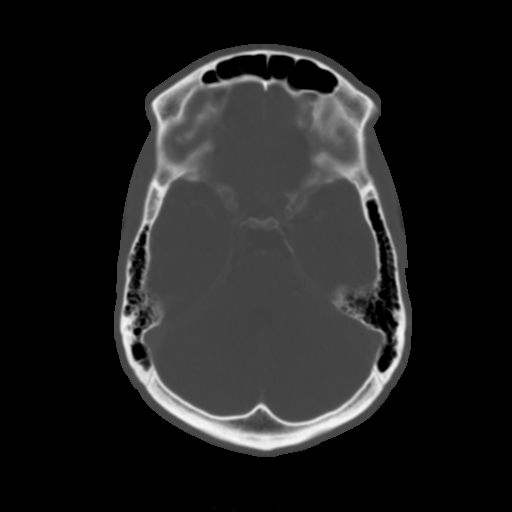
[im 16/36  brain]
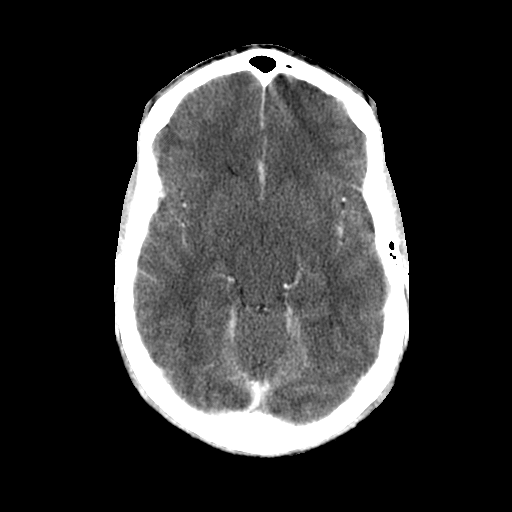
[im 18/36  brain]
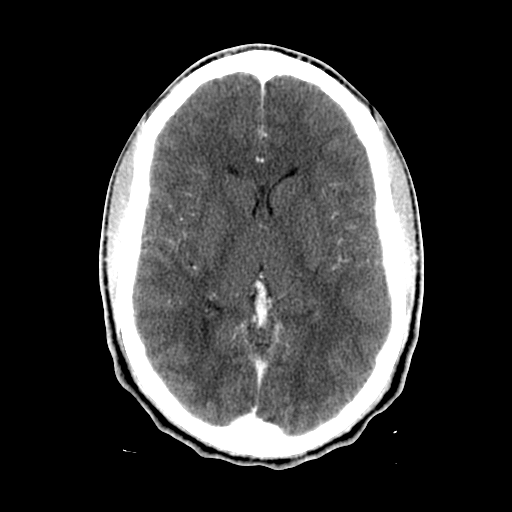
[im 21/36  brain]
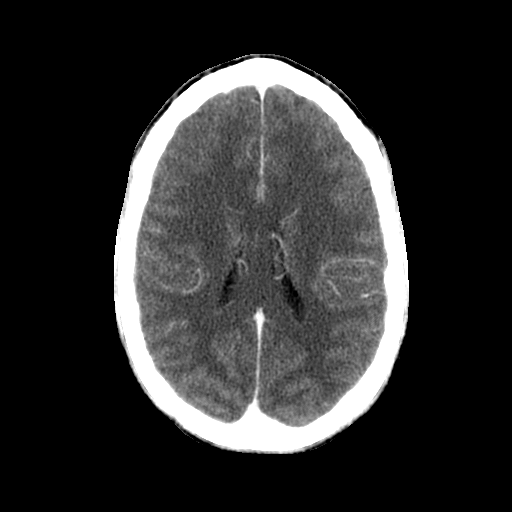
[im 23/36  brain]
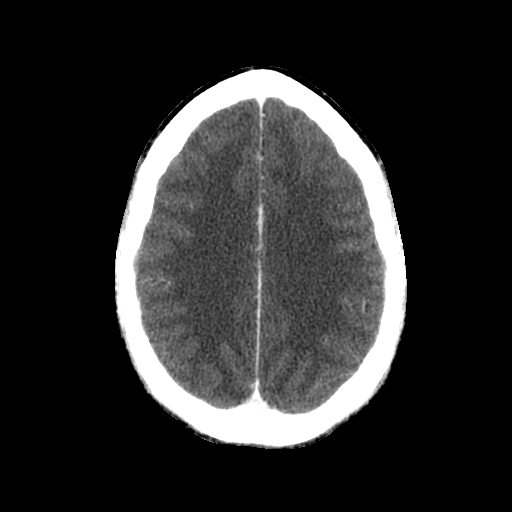
[im 23/36  bone]
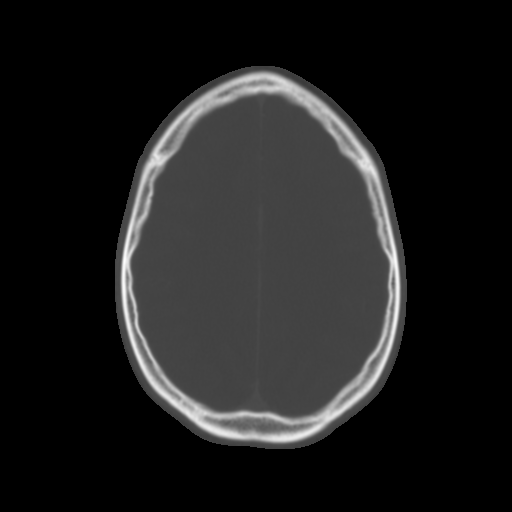
[im 26/36  brain]
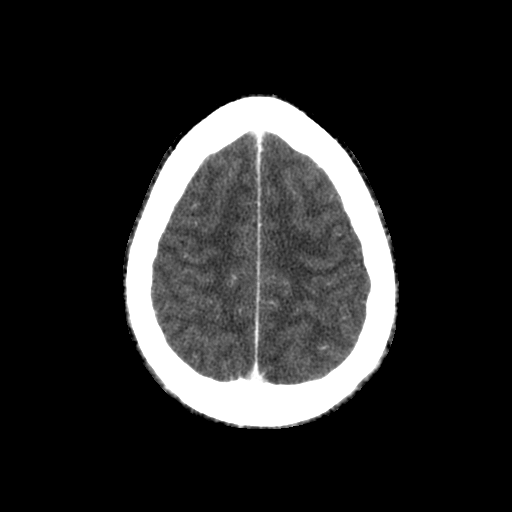
[im 28/36  brain]
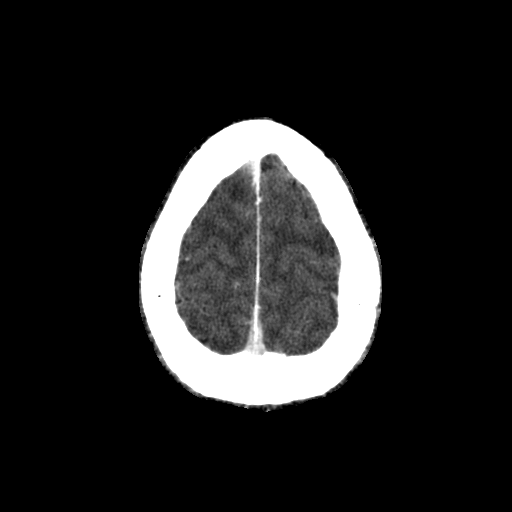
[im 31/36  brain]
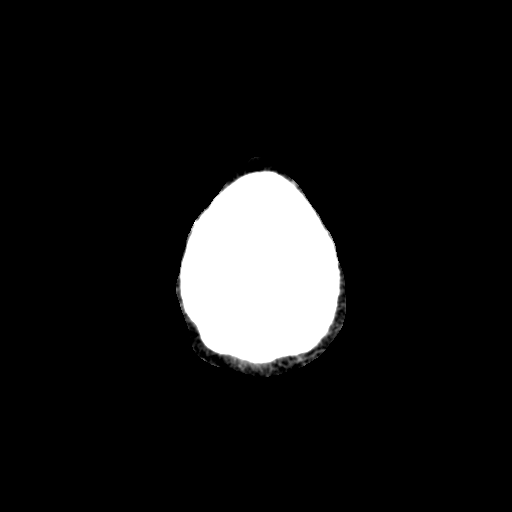
[im 33/36  brain]
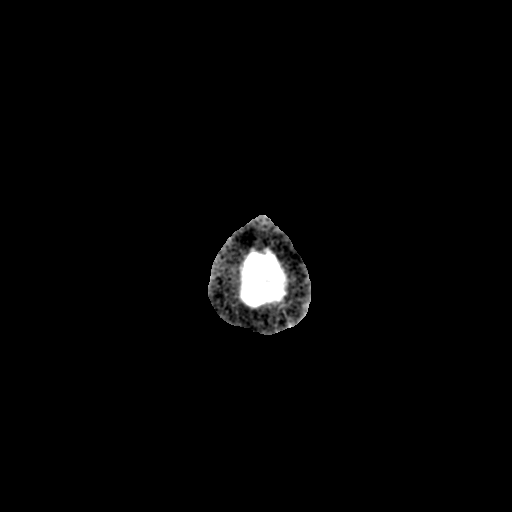
[im 33/36  bone]
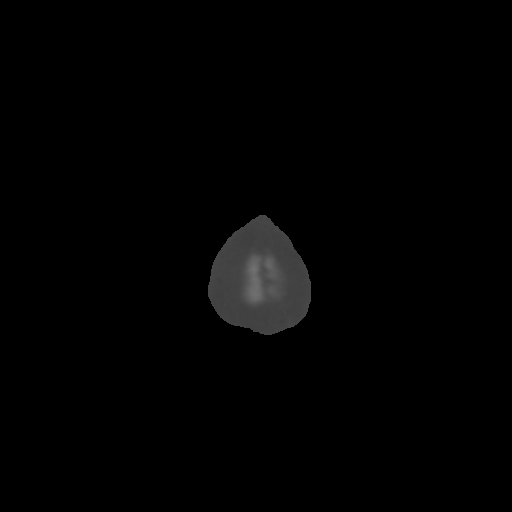

[13 of 30 positions shown; findings below may reference images not displayed]

FINDINGS: No acute intracranial abnormalities.  Specifically, no
evidence of intracranial mass, mass effect, hydrocephalus, acute
infarction, or abnormal intra or extra-axial fluid collections.  No
abnormal areas of enhancement on postcontrast images.  No displaced
skull fractures.  Visualized paranasal sinuses and mastoids are
generally well pneumatized.
IMPRESSION: 1.  No acute intracranial abnormalities.
2.  Normal appearance of the brain.

## 2013-07-30 IMAGING — CT CT ABD-PELV W/ CM
2 of 4 series · 13 of 32 positions shown, 19 images · IV contrast (80ml omni 300)
Comparison: 10/12/2008.

CLINICAL DATA: Left lower quadrant pain, fever, vomiting and
diarrhea.

CT ABDOMEN AND PELVIS WITH CONTRAST
TECHNIQUE: Multidetector CT imaging of the abdomen and pelvis was
performed following the standard protocol during bolus
administration of intravenous contrast.
Contrast:  80 ml Gmnipaque-L22.

[Series 2: routine abdomen · axial · 0.70mm/px · z∈[-494,-108]mm · 9 of 97 slices shown, 15 images]
[im 10/97  soft-tissue]
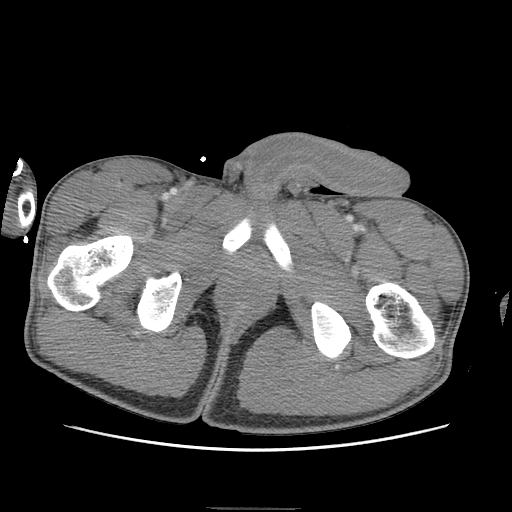
[im 10/97  bone]
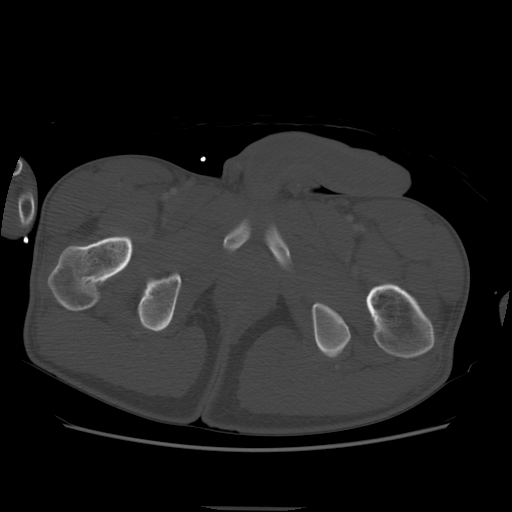
[im 20/97  soft-tissue]
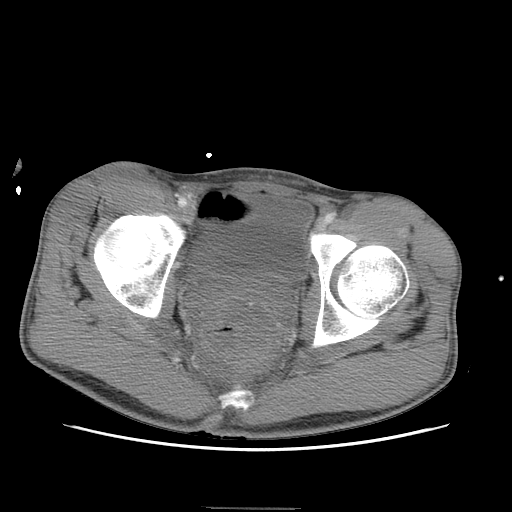
[im 29/97  soft-tissue]
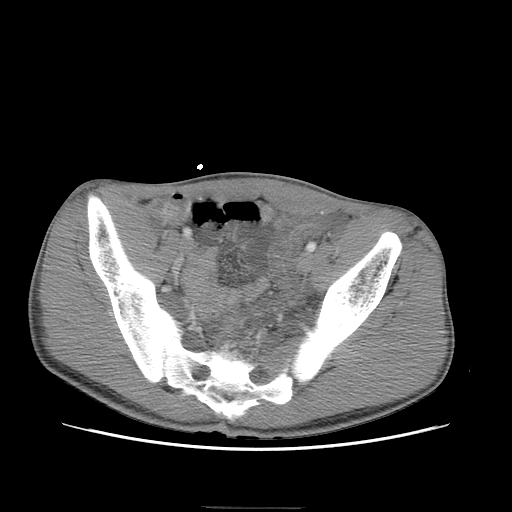
[im 39/97  soft-tissue]
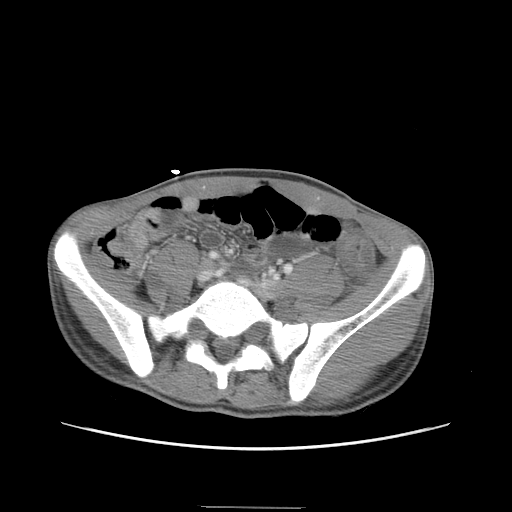
[im 49/97  soft-tissue]
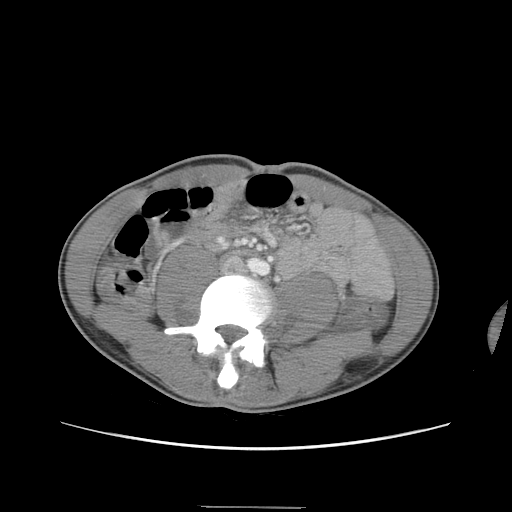
[im 58/97  soft-tissue]
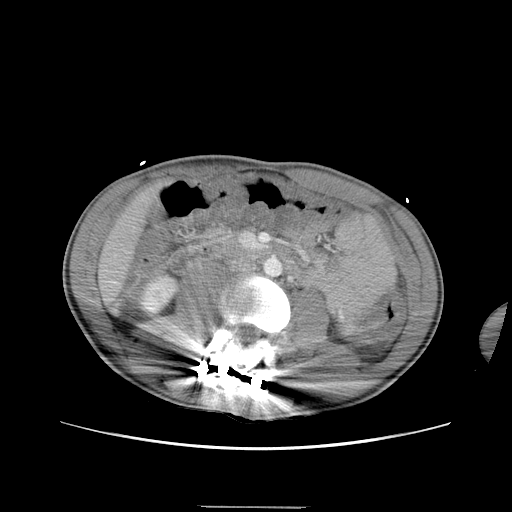
[im 58/97  lung]
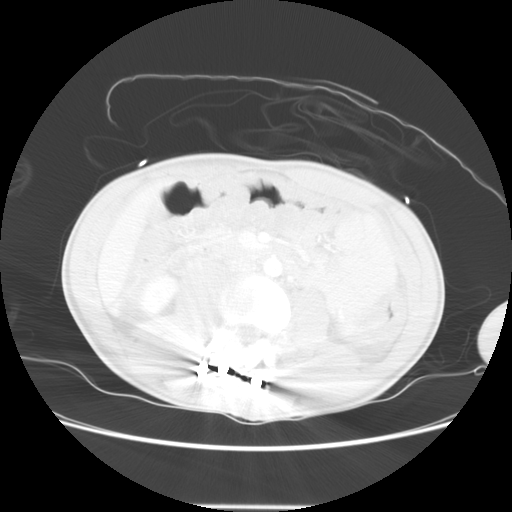
[im 68/97  soft-tissue]
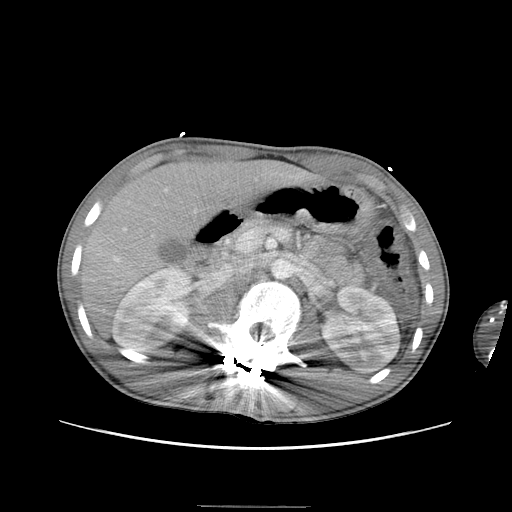
[im 68/97  lung]
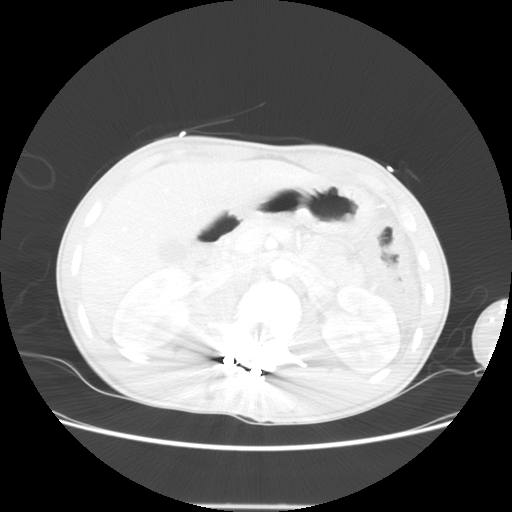
[im 77/97  soft-tissue]
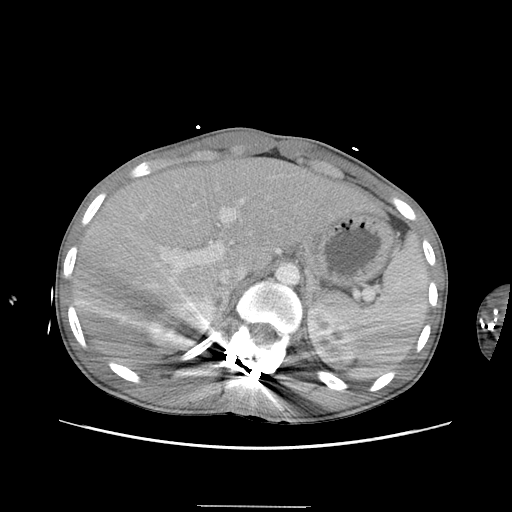
[im 77/97  lung]
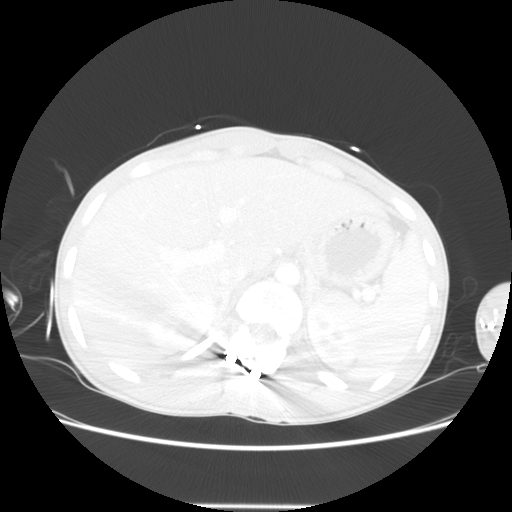
[im 87/97  soft-tissue]
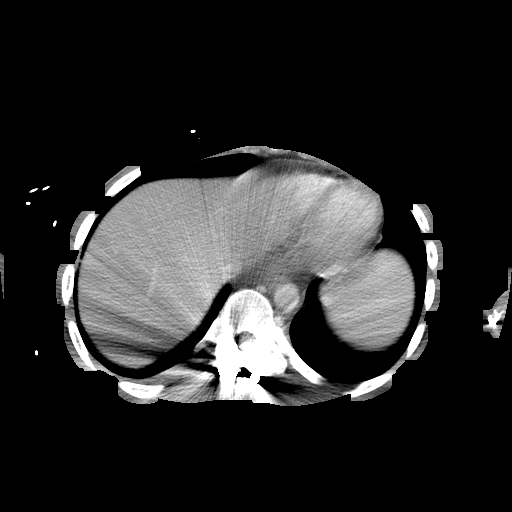
[im 87/97  lung]
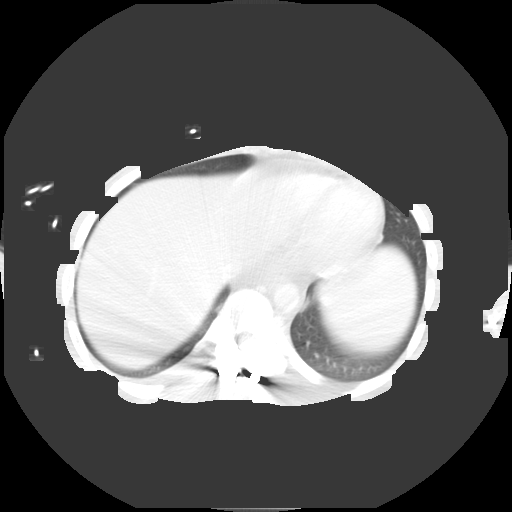
[im 87/97  bone]
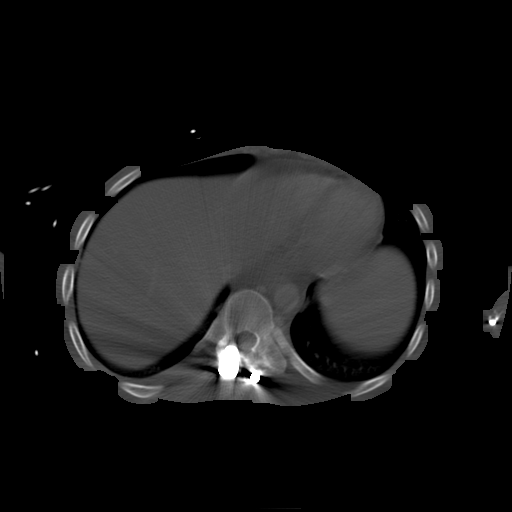

[Series 401: sag · sagittal · 0.96mm/px · 4 of 89 slices shown]
[im 10/89  soft-tissue]
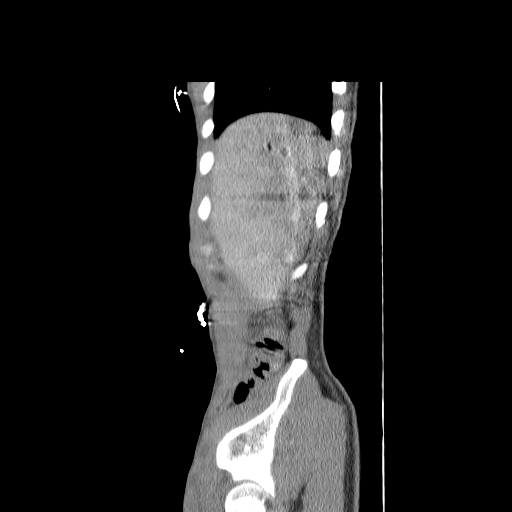
[im 20/89  soft-tissue]
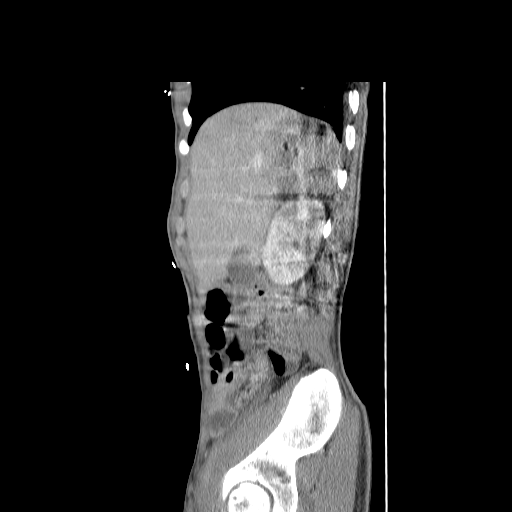
[im 30/89  soft-tissue]
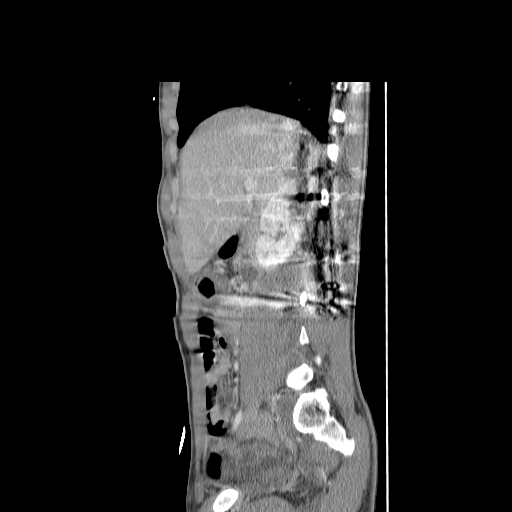
[im 40/89  soft-tissue]
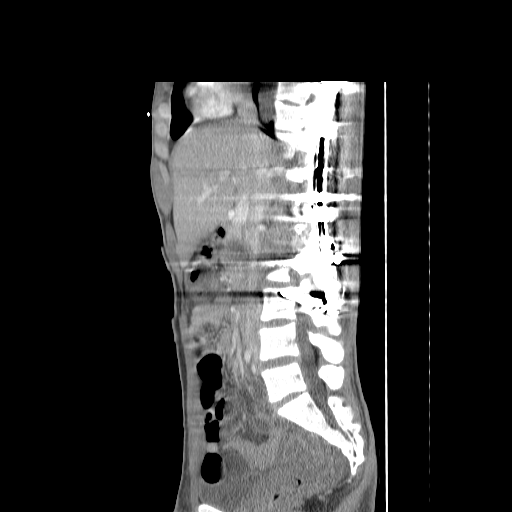

[13 of 32 positions shown; findings below may reference images not displayed]

FINDINGS: Streak artifact caused by hardware utilized for scoliosis
surgery.

Diffuse inflammation of the colon.  Question inflammatory versus
infectious in origin.  Ischemia not excluded however, this is felt
to be less likely consideration.  Celiac axis, superior mesenteric
artery and inferior mesenteric artery are patent.  No free
intraperitoneal air. Mild infiltration of fat planes most notable
in the pelvic region without drainable abscess noted.

Thickened appearance of the distal esophagus with mild infiltration
of surrounding fat planes.  Question result of mild esophagitis.

Taking into account the significant streak artifact from spinal
hardware, no focal liver, splenic, pancreatic, adrenal or renal
lesion.  No calcified gallstones.

Noncontrast filled views of the urinary bladder unremarkable.
Scattered normal/top normal sized lymph nodes mesenteric and
retroperitoneal region.
IMPRESSION: Diffuse inflammation of the colon.  Question inflammatory versus
infectious in origin.

Thickened appearance of the distal esophagus with mild infiltration
of surrounding fat planes.  Question result of mild esophagitis.

## 2013-09-09 ENCOUNTER — Telehealth: Payer: Self-pay | Admitting: *Deleted

## 2013-09-09 NOTE — Telephone Encounter (Signed)
Patient called c/o penile discharge. No available appointments today; advised patient to be seen at Urgent Care. He states he called his PCP and was told they had no available appointments today either. Wendall MolaJacqueline Cockerham

## 2013-09-12 ENCOUNTER — Encounter: Payer: Self-pay | Admitting: Internal Medicine

## 2013-09-12 ENCOUNTER — Ambulatory Visit (INDEPENDENT_AMBULATORY_CARE_PROVIDER_SITE_OTHER): Payer: Self-pay | Admitting: Internal Medicine

## 2013-09-12 ENCOUNTER — Other Ambulatory Visit (HOSPITAL_COMMUNITY)
Admission: RE | Admit: 2013-09-12 | Discharge: 2013-09-12 | Disposition: A | Discharge status: Home / Self Care | Payer: Medicaid Other | Source: Ambulatory Visit | Attending: Internal Medicine | Admitting: Internal Medicine

## 2013-09-12 VITALS — BP 136/84 | HR 69 | Temp 98.1°F | Wt 152.0 lb

## 2013-09-12 DIAGNOSIS — Z21 Asymptomatic human immunodeficiency virus [HIV] infection status: Secondary | ICD-10-CM

## 2013-09-12 DIAGNOSIS — R369 Urethral discharge, unspecified: Secondary | ICD-10-CM

## 2013-09-12 DIAGNOSIS — B20 Human immunodeficiency virus [HIV] disease: Secondary | ICD-10-CM

## 2013-09-12 DIAGNOSIS — Z113 Encounter for screening for infections with a predominantly sexual mode of transmission: Secondary | ICD-10-CM | POA: Insufficient documentation

## 2013-09-12 DIAGNOSIS — A5400 Gonococcal infection of lower genitourinary tract, unspecified: Secondary | ICD-10-CM

## 2013-09-12 DIAGNOSIS — A549 Gonococcal infection, unspecified: Secondary | ICD-10-CM | POA: Insufficient documentation

## 2013-09-12 MED ORDER — AZITHROMYCIN 250 MG PO TABS: ORAL_TABLET | ORAL | Status: DC

## 2013-09-12 MED ORDER — CEFTRIAXONE SODIUM 1 G IJ SOLR
500.0000 mg | Freq: Once | INTRAMUSCULAR | Status: AC
  Administered 2013-09-12: 500 mg via INTRAMUSCULAR

## 2013-09-12 MED ORDER — CEFTRIAXONE SODIUM 250 MG IJ SOLR: 500.0000 mg | Freq: Once | INTRAMUSCULAR | Status: DC

## 2013-09-12 NOTE — Addendum Note (Signed)
Addended by: Wendall MolaOCKERHAM, JACQUELINE A on: 09/12/2013 04:45 PM   Modules accepted: Orders

## 2013-09-12 NOTE — Progress Notes (Signed)
Patient ID: Sharilyn SitesLashawn M Kaufhold, male   DOB: September 23, 1990, 23 y.o.   MRN: [id number]          Patient Active Problem List   Diagnosis Date Noted  . Gonorrhea 09/12/2013  . Tobacco use disorder 06/27/2013  . Penile discharge 06/27/2013  . Bipolar I disorder, most recent episode (or current) depressed, severe, with psychotic behavior 02/25/2013  . Cannabis abuse 02/25/2013  . Cocaine abuse with cocaine-induced mood disorder 02/25/2013  . Internal hemorrhoid 12/20/2012  . Anal lesion 10/11/2012  . Vitamin D deficiency 03/31/2012  . Encounter for long-term (current) use of medications 03/30/2012  . Chronic back pain 10/27/2011  . Constipation 08/26/2011  . Hemorrhoids 08/26/2011  . Suicidal ideation 07/30/2011  . Severe depression 07/30/2011  . Nausea 06/11/2011  . Syphilis 06/09/2011  . HIV (human immunodeficiency virus infection) 05/30/2011  . GERD (gastroesophageal reflux disease) 08/07/2010  . Unprotected sexual intercourse 08/07/2010  . SCOLIOSIS 11/30/2007    Patient's Medications  New Prescriptions   No medications on file  Previous Medications   ARIPIPRAZOLE (ABILIFY MAINTENA) 400 MG SUSR    Inject 400 mg into the muscle every 28 (twenty-eight) days.   DULOXETINE (CYMBALTA) 30 MG CAPSULE    Take 1 capsule (30 mg total) by mouth daily.   ELVITEGRAVIR-COBICISTAT-EMTRICITABINE-TENOFOVIR (STRIBILD) 150-150-200-300 MG TABS TABLET    Take 1 tablet by mouth daily with breakfast.   ESCITALOPRAM (LEXAPRO) 10 MG TABLET    Take 1 tablet (10 mg total) by mouth daily.   GABAPENTIN (NEURONTIN) 300 MG CAPSULE    Take 2 capsules (600 mg total) by mouth 3 (three) times daily.   OMEPRAZOLE (PRILOSEC) 20 MG CAPSULE    Take 1 capsule (20 mg total) by mouth daily.   TRAZODONE (DESYREL) 50 MG TABLET    Take 1 tablet (50 mg total) by mouth at bedtime as needed and may repeat dose one time if needed for sleep.  Modified Medications   Modified Medication Previous Medication   AZITHROMYCIN (ZITHROMAX  Z-PAK) 250 MG TABLET azithromycin (ZITHROMAX Z-PAK) 250 MG tablet      1g po x 1    1g po x 1   CEFTRIAXONE (ROCEPHIN) 250 MG INJECTION cefTRIAXone (ROCEPHIN) 250 MG injection      Inject 500 mg into the muscle once. FOR IM use in LARGE MUSCLE MASS    Inject 250 mg into the muscle once.  FOR IM use in LARGE MUSCLE MASS  Discontinued Medications   No medications on file    Subjective: Flint MelterLashawn is seen on a work-in basis. He was seen in early April by my partner, Dr. Ninetta LightsHatcher, with urethral discharge. He was found to have gonorrhea and was treated with ceftriaxone 250 mg IM and azithromycin 1 g by mouth. He states that his discharge a little better but never went away. He is back in today for further evaluation. He has dysuria. He has had 2 partners in the past 6 months. Both are HIV infected. They do not always use condoms. He thinks they may have gone to the health department for testing and treatment after he was diagnosed 2 months ago but he is not certain. He has not missed doses of his Stribild. Review of Systems: Pertinent items are noted in HPI.  Past Medical History  Diagnosis Date  . Scoliosis   . Syphilis 2012, june    not clear he completed treatment.  non-compliant with follow up.  . Syphilis 08/09/2010  . HIV (human immunodeficiency virus infection)   .  Gonorrhea in male   . Subungual hematoma of finger 03/23/2012  . 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 Tinea versicolor 10/08/2010  . Scabies 12/17/2011  . Boil 10/11/2012    History  Substance Use Topics  . Smoking status: Current Some Day Smoker -- 0.30 packs/day for 2 years    Types: Cigarettes  . Smokeless tobacco: Never Used     Comment: trying to cut back  . Alcohol Use: No    No family history on file.  No Known Allergies  Objective: Temp: 98.1 F (36.7 C) (06/29 1607) Temp src: Oral (06/29 1607) BP: 136/84 mmHg (06/29 1607) Pulse Rate: 69 (06/29 1607) Body mass index is 19.51 kg/(m^2).  General: He is in  no distress Oral: No oropharyngeal lesions Skin: No rash Lungs: Clear Cor: Regular S1-S2 without murmurs GU: He is thick white penile discharge. He has no penile or perineal lesions palpation of the scrotum and testes normal  Lab Results Lab Results  Component Value Date   WBC 4.6 03/23/2013   HGB 15.0 03/23/2013   HCT 44.1 03/23/2013   MCV 94.8 03/23/2013   PLT 199 03/23/2013    Lab Results  Component Value Date   CREATININE 0.84 03/23/2013   BUN 14 03/23/2013   NA 138 03/23/2013   K 4.8 03/23/2013   CL 103 03/23/2013   CO2 28 03/23/2013    Lab Results  Component Value Date   ALT 14 03/23/2013   AST 16 03/23/2013   ALKPHOS 84 03/23/2013   BILITOT 0.4 03/23/2013    Lab Results  Component Value Date   CHOL 143 08/05/2012   HDL 34* 08/05/2012   LDLCALC 87 08/05/2012   TRIG 111 08/05/2012   CHOLHDL 4.2 08/05/2012    Lab Results HIV 1 RNA Quant (copies/mL)  Date Value  03/23/2013 <20   08/05/2012 <20   03/23/2012 <20      CD4 T Cell Abs (/uL)  Date Value  03/23/2013 1000   08/05/2012 890   03/23/2012 850      Assessment: Persistent penile discharge after recent treatment for gonorrhea. There is always some, small risk that he is infected with a resistant GC versus reinfection. I talked to him about the importance of partner selection and condom use and having his partners tested and treated. I will treat him with a slightly higher dose of ceftriaxone along with azithromycin again.  Plan: 1. Check urine for GC and Chlamydia 2. RPR 3. CD4 count, HIV viral load, and basic metabolic panel 4. Ceftriaxone 500 mg IM 5. Azithromycin 1 g by mouth 6. Prevention for positive counseling provided   Cliffton AstersJohn Makena Mcgrady, MD [facility] [phone] pager   [phone] cell 09/12/2013, 4:24 PM

## 2013-09-13 LAB — BASIC METABOLIC PANEL
BUN: 14 mg/dL (ref 6–23)
CALCIUM: 9.4 mg/dL (ref 8.4–10.5)
CO2: 30 mEq/L (ref 19–32)
Chloride: 102 mEq/L (ref 96–112)
Creat: 0.75 mg/dL (ref 0.50–1.35)
GLUCOSE: 100 mg/dL — AB (ref 70–99)
Potassium: 4.1 mEq/L (ref 3.5–5.3)
Sodium: 140 mEq/L (ref 135–145)

## 2013-09-13 LAB — T.PALLIDUM AB, TOTAL

## 2013-09-13 LAB — RPR TITER: RPR Titer: 1:1 {titer}

## 2013-09-13 LAB — RPR: RPR: REACTIVE — AB

## 2013-09-14 LAB — T-HELPER CELL (CD4) - (RCID CLINIC ONLY)
CD4 % Helper T Cell: 44 % (ref 33–55)
CD4 T CELL ABS: 780 /uL (ref 400–2700)

## 2013-09-16 LAB — HIV-1 RNA QUANT-NO REFLEX-BLD
HIV 1 RNA Quant: <20 copies/mL (ref <20)
HIV-1 RNA Quant, Log: <1.3 {Log} (ref <1.30)

## 2013-09-18 ENCOUNTER — Emergency Department (HOSPITAL_COMMUNITY): Payer: Medicaid Other

## 2013-09-18 ENCOUNTER — Emergency Department (HOSPITAL_COMMUNITY)
Admission: EM | Admit: 2013-09-18 | Discharge: 2013-09-18 | Disposition: A | Discharge status: Home / Self Care | Payer: Medicaid Other | Attending: Emergency Medicine | Admitting: Emergency Medicine

## 2013-09-18 ENCOUNTER — Encounter (HOSPITAL_COMMUNITY): Payer: Self-pay | Admitting: Emergency Medicine

## 2013-09-18 DIAGNOSIS — Z79899 Other long term (current) drug therapy: Secondary | ICD-10-CM | POA: Insufficient documentation

## 2013-09-18 DIAGNOSIS — Z8619 Personal history of other infectious and parasitic diseases: Secondary | ICD-10-CM | POA: Insufficient documentation

## 2013-09-18 DIAGNOSIS — M545 Low back pain, unspecified: Secondary | ICD-10-CM

## 2013-09-18 DIAGNOSIS — M4120 Other idiopathic scoliosis, site unspecified: Secondary | ICD-10-CM | POA: Insufficient documentation

## 2013-09-18 DIAGNOSIS — Z87828 Personal history of other (healed) physical injury and trauma: Secondary | ICD-10-CM | POA: Insufficient documentation

## 2013-09-18 DIAGNOSIS — Z872 Personal history of diseases of the skin and subcutaneous tissue: Secondary | ICD-10-CM | POA: Insufficient documentation

## 2013-09-18 DIAGNOSIS — F17200 Nicotine dependence, unspecified, uncomplicated: Secondary | ICD-10-CM | POA: Insufficient documentation

## 2013-09-18 DIAGNOSIS — Z21 Asymptomatic human immunodeficiency virus [HIV] infection status: Secondary | ICD-10-CM | POA: Insufficient documentation

## 2013-09-18 LAB — URINALYSIS, ROUTINE W REFLEX MICROSCOPIC
BILIRUBIN URINE: NEGATIVE
Glucose, UA: NEGATIVE mg/dL
Hgb urine dipstick: NEGATIVE
Ketones, ur: NEGATIVE mg/dL
Leukocytes, UA: NEGATIVE
NITRITE: NEGATIVE
Protein, ur: 30 mg/dL — AB
Specific Gravity, Urine: 1.035 — ABNORMAL HIGH (ref 1.005–1.030)
UROBILINOGEN UA: 2 mg/dL — AB (ref 0.0–1.0)
pH: 6.5 (ref 5.0–8.0)

## 2013-09-18 LAB — URINE MICROSCOPIC-ADD ON

## 2013-09-18 MED ORDER — OXYCODONE-ACETAMINOPHEN 5-325 MG PO TABS
2.0000 | ORAL_TABLET | Freq: Once | ORAL | Status: AC
  Administered 2013-09-18: 2 via ORAL
  Filled 2013-09-18: qty 2

## 2013-09-18 MED ORDER — METHOCARBAMOL 500 MG PO TABS
500.0000 mg | ORAL_TABLET | Freq: Once | ORAL | Status: AC
  Administered 2013-09-18: 500 mg via ORAL
  Filled 2013-09-18: qty 1

## 2013-09-18 MED ORDER — OXYCODONE-ACETAMINOPHEN 5-325 MG PO TABS: 1.0000 | ORAL_TABLET | Freq: Four times a day (QID) | ORAL | Status: AC | PRN

## 2013-09-18 MED ORDER — METHOCARBAMOL 500 MG PO TABS: 500.0000 mg | ORAL_TABLET | Freq: Two times a day (BID) | ORAL | Status: AC

## 2013-09-18 MED ORDER — HYDROMORPHONE HCL PF 1 MG/ML IJ SOLN: 1.0000 mg | Freq: Once | INTRAMUSCULAR | Status: DC

## 2013-09-18 NOTE — ED Notes (Signed)
Declined W/C at D/C and was escorted to lobby by RN. 

## 2013-09-18 NOTE — ED Notes (Signed)
Pt. Stated, i have rods in my ack, and Im having back pain for 3 days.

## 2013-09-18 NOTE — ED Provider Notes (Signed)
CSN: [id number]     Arrival date & time 09/18/13  0904 History   This chart was scribed for non-physician practitioner Santiago Glad, PA-C,  working with Gwyneth Sprout, MD, by Yevette Edwards, ED Scribe. This patient was seen in room TR08C/TR08C and the patient's care was started at 10:01 AM.  First MD Initiated Contact with Patient 09/18/13 [phone]     Chief Complaint  Patient presents with  . Back Pain    The history is provided by the patient. No language interpreter was used.   HPI Comments: Richard Rojas is a 23 y.o. male, with a h/o HIV, scoliosis, and back surgery six years ago,  who presents to the Emergency Department complaining of constant right-sided back pain which was present upon awakening three days ago and worsened today. He reports the pain is increased with movements such as bending and ambulation. He has used Tylenol without resolution. The pt denies any recent falls, injuries, heavy lifting, or trauma. He also denies radiation of the pain into his leg or groin. Additionally, he denies urinary/bowel incontinence, dysuria, hematuria, frequency, numbness, paresthesia, fever, abdominal pain, or emesis.  The pt is HIV positive, and he reports he his CD-4 count was assessed last week, but he is unsure of the results. He denies a h/o DM, renal calculi, or IV drug usage.   Per medical records, the pt's CD-4 count was 780 six days ago.   Past Medical History  Diagnosis Date  . Scoliosis   . Syphilis 2012, june    not clear he completed treatment.  non-compliant with follow up.  . Syphilis 08/09/2010  . HIV (human immunodeficiency virus infection)   . Gonorrhea in male   . Subungual hematoma of finger 03/23/2012  . 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 Tinea versicolor 10/08/2010  . Scabies 12/17/2011  . Boil 10/11/2012   Past Surgical History  Procedure Laterality Date  . Spine surgery      harrington rod placement for scoliosis  . Back surgery     No family history on  file. History  Substance Use Topics  . Smoking status: Current Some Day Smoker -- 0.30 packs/day for 2 years    Types: Cigarettes  . Smokeless tobacco: Never Used     Comment: trying to cut back  . Alcohol Use: No    Review of Systems  Constitutional: Negative for fever.  Gastrointestinal: Negative for vomiting and abdominal pain.  Genitourinary: Negative for dysuria, urgency, hematuria and testicular pain.  Musculoskeletal: Positive for back pain.  Neurological: Negative for weakness and numbness.  All other systems reviewed and are negative.   Allergies  Review of patient's allergies indicates no known allergies.  Home Medications   Prior to Admission medications   Medication Sig Start Date End Date Taking? Authorizing Provider  ARIPiprazole (ABILIFY MAINTENA) 400 MG SUSR Inject 400 mg into the muscle every 28 (twenty-eight) days. 03/01/13   Fransisca Kaufmann, NP  azithromycin (ZITHROMAX Z-PAK) 250 MG tablet 1g po x 1 09/12/13   [name], MD  cefTRIAXone (ROCEPHIN) 250 MG injection Inject 500 mg into the muscle once. FOR IM use in LARGE MUSCLE MASS 09/12/13   [name], MD  DULoxetine (CYMBALTA) 30 MG capsule Take 1 capsule (30 mg total) by mouth daily. 06/06/13   Andrena Mews, DO  elvitegravir-cobicistat-emtricitabine-tenofovir (STRIBILD) 150-150-200-300 MG TABS tablet Take 1 tablet by mouth daily with breakfast. 03/22/13   Randall Hiss, MD  escitalopram (LEXAPRO) 10 MG tablet Take 1 tablet (  10 mg total) by mouth daily. 03/01/13   Fransisca KaufmannLaura Davis, NP  gabapentin (NEURONTIN) 300 MG capsule Take 2 capsules (600 mg total) by mouth 3 (three) times daily. 05/24/13   Tommie SamsJayce G Cook, DO  omeprazole (PRILOSEC) 20 MG capsule Take 1 capsule (20 mg total) by mouth daily. 04/14/13   Gardiner Barefootobert W Comer, MD  traZODone (DESYREL) 50 MG tablet Take 1 tablet (50 mg total) by mouth at bedtime as needed and may repeat dose one time if needed for sleep. 03/01/13   Fransisca KaufmannLaura Davis, NP   Triage Vitals: BP  123/76  Pulse 101  Temp(Src) 98.3 F (36.8 C) (Oral)  Resp 22  Wt 153 lb (69.4 kg)  SpO2 100%  Physical Exam  Nursing note and vitals reviewed. Constitutional: He is oriented to person, place, and time. He appears well-developed and well-nourished. No distress.  HENT:  Head: Normocephalic and atraumatic.  Eyes: Conjunctivae and EOM are normal.  Neck: Neck supple. No tracheal deviation present.  Cardiovascular: Normal rate and regular rhythm.  Exam reveals no friction rub.   No murmur heard. Pulses:      Dorsalis pedis pulses are 2+ on the right side, and 2+ on the left side.  Pulmonary/Chest: Effort normal and breath sounds normal. No respiratory distress. He has no wheezes.  Abdominal: There is no tenderness. There is no rebound and no guarding.  Musculoskeletal: Normal range of motion. He exhibits tenderness.       Thoracic back: He exhibits normal range of motion, no bony tenderness, no swelling, no edema and no deformity.   No lumbar tenderness to palpation. No cervical tenderness to palpation. Well-healed scar over the thoracic spine. No erythema, edema, or warmth.  Tenderness to palpation of the right lumbar paraspinal muscles and over the right flank area.  2+ patellar reflexes.  Distal sensation of both feet are intact.  Normal gait.   Neurological: He is alert and oriented to person, place, and time. He has normal strength and normal reflexes. No sensory deficit.  Reflex Scores:      Patellar reflexes are 2+ on the right side and 2+ on the left side. Skin: Skin is warm and dry.  Psychiatric: He has a normal mood and affect. His behavior is normal.    ED Course  Procedures (including critical care time)  DIAGNOSTIC STUDIES: Oxygen Saturation is 100% on room air, normal by my interpretation.    COORDINATION OF CARE:  10:10 AM- Discussed treatment plan with patient, and the patient agreed to the plan. The plan includes a UA, lab work, and pain medication.   12:00  PM- Recheck pt. Pt reports he is feeling improved. Will prescribe pt muscle relaxants and pain medication.   Labs Review Labs Reviewed  URINALYSIS, ROUTINE W REFLEX MICROSCOPIC - Abnormal; Notable for the following:    Specific Gravity, Urine 1.035 (*)    Protein, ur 30 (*)    Urobilinogen, UA 2.0 (*)    All other components within normal limits  URINE MICROSCOPIC-ADD ON    Imaging Review Dg Thoracic Spine 2 View  09/18/2013   CLINICAL DATA:  Back pain  EXAM: THORACIC SPINE - 2 VIEW  COMPARISON:  03/23/2012  FINDINGS: Fixation rods are again identified. No hardware failure is seen. Mild S-shaped scoliosis is again noted. No compression deformities are seen. The visualized rib cage is within normal limits.  IMPRESSION: Postsurgical changes.  No acute abnormality is noted.   Electronically Signed   By: Eulah PontMark  Lukens M.D.  On: 09/18/2013 10:53     EKG Interpretation None      MDM   Final diagnoses:  None   Patient with back pain.  No neurological deficits and normal neuro exam.  Patient can walk but states is painful.  No loss of bowel or bladder control.  No concern for cauda equina.  No fever, night sweats, weight loss, h/o cancer, IVDU.  Patient does have history of HIV, however, he is afebrile.  Pain not located over the spine is not localized.  Pain is not radicular.  Pain worse with movement.  Feel that the pain is more consistent with muscle pain.  RICE protocol and pain medicine indicated and discussed with patient.  Strict return precautions given.    Santiago GladHeather Lorain Keast, PA-C 09/21/13 2312

## 2013-09-19 ENCOUNTER — Other Ambulatory Visit: Payer: Self-pay

## 2013-09-21 ENCOUNTER — Other Ambulatory Visit: Payer: Self-pay

## 2013-09-22 NOTE — ED Provider Notes (Signed)
Medical screening examination/treatment/procedure(s) were performed by non-physician practitioner and as supervising physician I was immediately available for consultation/collaboration.   EKG Interpretation None        Gwyneth SproutWhitney Tarica Harl, MD 09/22/13 1535

## 2013-10-01 ENCOUNTER — Other Ambulatory Visit: Payer: Self-pay | Admitting: Sports Medicine

## 2013-10-02 ENCOUNTER — Other Ambulatory Visit: Payer: Self-pay | Admitting: Sports Medicine

## 2013-10-03 ENCOUNTER — Ambulatory Visit: Payer: Self-pay | Admitting: Infectious Disease

## 2013-10-25 ENCOUNTER — Other Ambulatory Visit: Payer: Self-pay | Admitting: Internal Medicine

## 2013-10-25 ENCOUNTER — Other Ambulatory Visit: Payer: Self-pay | Admitting: Infectious Disease

## 2013-11-07 ENCOUNTER — Ambulatory Visit: Payer: Self-pay | Admitting: Infectious Disease

## 2013-11-07 ENCOUNTER — Telehealth: Payer: Self-pay | Admitting: *Deleted

## 2013-11-07 NOTE — Telephone Encounter (Signed)
Pt states that he has recently moved to [place].  Advised pt to locate and begin care in [place] as soon as possible to continue HIV medications.  Pt verbalized understanding.  Dr. Daiva Eves made aware.

## 2013-11-09 ENCOUNTER — Telehealth: Payer: Self-pay | Admitting: *Deleted

## 2013-11-09 NOTE — Telephone Encounter (Signed)
Patient has relocated to [facility]. Received record request for patient's entire chart from [facility] in [place], [place].  Placed for pick up by medical records.   Andree Coss, RN

## 2013-11-25 ENCOUNTER — Other Ambulatory Visit: Payer: Self-pay | Admitting: Infectious Disease

## 2013-11-25 ENCOUNTER — Other Ambulatory Visit: Payer: Self-pay | Admitting: Internal Medicine

## 2013-11-28 ENCOUNTER — Other Ambulatory Visit: Payer: Self-pay | Admitting: Infectious Disease

## 2013-11-28 ENCOUNTER — Other Ambulatory Visit: Payer: Self-pay | Admitting: Internal Medicine

## 2013-12-05 ENCOUNTER — Other Ambulatory Visit: Payer: Self-pay | Admitting: Infectious Disease

## 2013-12-05 ENCOUNTER — Other Ambulatory Visit: Payer: Self-pay | Admitting: Internal Medicine
